# Patient Record
Sex: Male | Born: 1955 | Race: White | Hispanic: No | Smoking: Never smoker
Health system: Southern US, Community
[De-identification: ages and names within clinical notes are randomized; demographics above are authoritative.]

## PROBLEM LIST (undated history)

## (undated) DIAGNOSIS — R972 Elevated prostate specific antigen [PSA]: Secondary | ICD-10-CM

## (undated) DIAGNOSIS — T8859XA Other complications of anesthesia, initial encounter: Secondary | ICD-10-CM

## (undated) DIAGNOSIS — J302 Other seasonal allergic rhinitis: Secondary | ICD-10-CM

## (undated) DIAGNOSIS — N200 Calculus of kidney: Secondary | ICD-10-CM

## (undated) DIAGNOSIS — E669 Obesity, unspecified: Secondary | ICD-10-CM

## (undated) DIAGNOSIS — N529 Male erectile dysfunction, unspecified: Secondary | ICD-10-CM

## (undated) DIAGNOSIS — T4145XA Adverse effect of unspecified anesthetic, initial encounter: Secondary | ICD-10-CM

## (undated) DIAGNOSIS — I10 Essential (primary) hypertension: Secondary | ICD-10-CM

## (undated) DIAGNOSIS — R7301 Impaired fasting glucose: Secondary | ICD-10-CM

## (undated) HISTORY — DX: Essential (primary) hypertension: I10

## (undated) HISTORY — DX: Impaired fasting glucose: R73.01

## (undated) HISTORY — DX: Male erectile dysfunction, unspecified: N52.9

## (undated) HISTORY — DX: Obesity, unspecified: E66.9

## (undated) HISTORY — DX: Other seasonal allergic rhinitis: J30.2

## (undated) HISTORY — DX: Elevated prostate specific antigen (PSA): R97.20

## (undated) HISTORY — PX: CARDIOVASCULAR STRESS TEST: SHX262

---

## 2002-09-01 ENCOUNTER — Encounter: Admission: RE | Admit: 2002-09-01 | Discharge: 2002-09-01 | Payer: Self-pay | Admitting: Sports Medicine

## 2002-09-01 ENCOUNTER — Encounter: Payer: Self-pay | Admitting: Sports Medicine

## 2007-05-27 HISTORY — PX: COLONOSCOPY: SHX174

## 2007-09-17 ENCOUNTER — Ambulatory Visit (HOSPITAL_COMMUNITY): Admission: RE | Admit: 2007-09-17 | Discharge: 2007-09-17 | Payer: Self-pay | Admitting: Internal Medicine

## 2007-09-17 ENCOUNTER — Encounter: Payer: Self-pay | Admitting: Internal Medicine

## 2007-09-17 ENCOUNTER — Ambulatory Visit: Payer: Self-pay | Admitting: Internal Medicine

## 2010-10-08 NOTE — Op Note (Signed)
NAME:  Oscar Campbell, Oscar Campbell NO.:  [id number]   MEDICAL RECORD NO.:  [id number]          PATIENT TYPE:  AMB   LOCATION:  DAY                           FACILITY:  [facility]   PHYSICIAN:  R. Roetta Sessions, M.D. DATE OF BIRTH:  [birth year]   DATE OF PROCEDURE:  09/17/2007  DATE OF DISCHARGE:                               OPERATIVE REPORT   Colonoscopy snare polypectomy.   INDICATIONS FOR PROCEDURE:  A 55 year old gentleman with lower GI tract  symptoms, positive family history of colon polyps in his father, but no  family history of colorectal cancer.  Colonoscopy is now being done.  He  has never had his lower GI tract imaged, and he is devoid of any lower  GI tract symptoms.  Risks, benefits, alternatives, and limitations have  been reviewed.  Oscar Campbell had his questions answered, and he is  agreeable.  Please see documentation and medical record.   PROCEDURE NOTE:  O2 saturation, blood pressure, pulse, and respirations  monitored throughout the entire procedure.   CONSCIOUS SEDATION:  Versed 5 mg IV and Demerol 100 mg IV in divided  doses.   INSTRUMENT:  Pentax video chip system.   FINDINGS:  Digital rectal exam revealed no abnormalities.  Endoscopic  findings:  The prep was adequate.  Colon:  Colonic mucosa was surveyed  from the rectosigmoid junction through the left transverse, right colon,  appendiceal orifice, ileocecal valve, and cecum.  These structures were  well seen and photographed for the record.  From this level, scope was  slowly cautiously withdrawn.  All previously mentioned mucosal surfaces  were again seen.  The patient had a 6-mm polyp on the stalk of the mid  ascending colon which was cold snared.  There were 2 other pedunculated  polyps in the mid descending colon which were also cold snared and  removed.  The remainder of colonic mucosa appeared normal.  The scope  was pulled down to the rectum with examination of rectal mucosa,  including  retroflexed view of the anal verge demonstrated no  abnormalities.  The patient tolerated the procedure well and left the  endoscopy area.   IMPRESSION:  Normal rectum, colonic polyps removed as described above,  right colon mucosa appeared normal.   RECOMMENDATIONS:  1. Follow up on path.  2. Further recommendations to follow.      Jonathon Bellows, M.D.  Electronically Signed     RMR/MEDQ  D:  09/17/2007  T:  09/18/2007  Job:  [id number]   cc:   Donna Bernard, M.D.  Fax: [phone]

## 2011-01-20 ENCOUNTER — Ambulatory Visit (INDEPENDENT_AMBULATORY_CARE_PROVIDER_SITE_OTHER): Payer: BC Managed Care – PPO | Admitting: Adult Health

## 2011-01-20 ENCOUNTER — Encounter: Payer: Self-pay | Admitting: *Deleted

## 2011-01-20 ENCOUNTER — Encounter: Payer: Self-pay | Admitting: Adult Health

## 2011-01-20 DIAGNOSIS — I10 Essential (primary) hypertension: Secondary | ICD-10-CM

## 2011-01-20 DIAGNOSIS — R079 Chest pain, unspecified: Secondary | ICD-10-CM | POA: Insufficient documentation

## 2011-01-20 NOTE — Assessment & Plan Note (Signed)
Currently better controlled on Vasotec 5 mg.  Will not make any medication changes at this time. Echocardiogram for Lv fx.

## 2011-01-20 NOTE — Progress Notes (Signed)
HPI:  [name] is a 55 y/o patient of [name] we are asked to see for recurrent chest pain. He has no prior cardac history.  He has recently been diagnosed with hypertension while being evaluated in [name] office last week and placed on Vasotec 5 mg for elevated BP of 158 -161 systolic.  He states 2 weeks ago, he had an occurrence of chest pressure, tightness when he awoke with his alarm to go to work. He states it lasted about 15-20 minutes and went away on its own.  He states he felt weak and fatigued for the rest of the day, actually going home to rest at lunch.  He felt the same the next day, without recurrence of chest discomfort and only worked a half day again.  He saw [name] the following day and had EKG completed that was abnormal.  He was referred to cardiology for further assessment.  He states he is now feeling better on the Vasotec for the last 3 days, but he is concerned about his heart.  Allergies  Allergen Reactions  . Penicillins     Current Outpatient Prescriptions  Medication Sig Dispense Refill  . aspirin 81 MG tablet Take 81 mg by mouth daily.        . enalapril (VASOTEC) 5 MG tablet Take 5 mg by mouth daily.          Past Medical History  Diagnosis Date  . Hypertension     No past surgical history on file.  JYN:WGNFAO of systems complete and found to be negative unless listed above PHYSICAL EXAM BP 141/80  Pulse 78  Resp 18  Ht 5\' 9"  (1.753 m)  Wt 243 lb (110.224 kg)  BMI 35.88 kg/m2  EKG:NSR, Left Axis deviation with poor R-wave progression. Rate of 80 bpm.  ASSESSMENT AND PLAN

## 2011-01-20 NOTE — Assessment & Plan Note (Signed)
He has family history of CAD in his father, and newly diagnosed hypertension. Review of current labs for cholesterol demonstrate at TC of 159, TG 118, LDL 99, HDL 36.  The patient has also been seen by Dr. Dietrich Pates as a new patient. He will have a stress echo for diagnostic and prognostic purposes.  He will follow-up with [place] after test is completed for more recommendations.

## 2011-01-20 NOTE — Patient Instructions (Addendum)
Echo Stress Echo Follow up after test above

## 2011-01-28 ENCOUNTER — Ambulatory Visit (HOSPITAL_COMMUNITY)
Admission: RE | Admit: 2011-01-28 | Discharge: 2011-01-28 | Disposition: A | Discharge status: Home / Self Care | Payer: BC Managed Care – PPO | Source: Ambulatory Visit | Attending: Cardiology | Admitting: Cardiology

## 2011-01-28 ENCOUNTER — Encounter (HOSPITAL_COMMUNITY): Payer: Self-pay | Admitting: Cardiology

## 2011-01-28 DIAGNOSIS — I10 Essential (primary) hypertension: Secondary | ICD-10-CM | POA: Insufficient documentation

## 2011-01-28 DIAGNOSIS — R079 Chest pain, unspecified: Secondary | ICD-10-CM

## 2011-01-28 DIAGNOSIS — Z8249 Family history of ischemic heart disease and other diseases of the circulatory system: Secondary | ICD-10-CM | POA: Insufficient documentation

## 2011-01-28 DIAGNOSIS — R072 Precordial pain: Secondary | ICD-10-CM

## 2011-01-28 DIAGNOSIS — I517 Cardiomegaly: Secondary | ICD-10-CM

## 2011-01-28 NOTE — Progress Notes (Signed)
*  PRELIMINARY RESULTS* Echocardiogram Echocardiogram Stress Test has been performed.  Oscar Campbell 01/28/2011, 10:59 AM

## 2011-01-28 NOTE — Progress Notes (Signed)
Stress Lab Nurses Notes - Oscar Campbell  [name] 01/28/2011  Reason for doing test: Chest Pain  Type of test: Stress Echo  Nurse performing test: Marlena Clipper, RN  Nuclear Medicine Tech: Not Applicable  Echo Tech: Karrie Doffing  MD performing test: R. Dietrich Pates  Family MD: Lubertha South  Test explained and consent signed: yes  IV started: No IV started  Symptoms: Sob  Treatment/Intervention: None  Reason test stopped: reached target HR and SOB  After recovery IV was: no iv inserted  Patient to return to Nuc. Med at :N/A  Patient discharged: Home  Patient's Condition upon discharge was: stable  Comments: Symptoms resolved in recovery. Resting HR77 BP130/70 and peak HR159 Peak BP 148/82.  [name]

## 2011-01-28 NOTE — Progress Notes (Signed)
*  PRELIMINARY RESULTS* Echocardiogram 2D Echocardiogram has been performed.  Oscar Campbell 01/28/2011, 9:34 AM

## 2011-02-03 ENCOUNTER — Encounter: Payer: Self-pay | Admitting: Adult Health

## 2011-02-04 ENCOUNTER — Ambulatory Visit: Payer: BC Managed Care – PPO | Admitting: Adult Health

## 2011-02-07 ENCOUNTER — Encounter: Payer: Self-pay | Admitting: Adult Health

## 2011-02-07 ENCOUNTER — Ambulatory Visit (INDEPENDENT_AMBULATORY_CARE_PROVIDER_SITE_OTHER): Payer: BC Managed Care – PPO | Admitting: Adult Health

## 2011-02-07 DIAGNOSIS — R079 Chest pain, unspecified: Secondary | ICD-10-CM

## 2011-02-07 DIAGNOSIS — I10 Essential (primary) hypertension: Secondary | ICD-10-CM

## 2011-02-07 NOTE — Assessment & Plan Note (Addendum)
Blood pressure is well controlled on current medications. No changes at this time.He is given instructions on low salt diet and encouraged to lose wt.

## 2011-02-07 NOTE — Progress Notes (Signed)
HPI:  [name] is a 55 y/o patient of [name] we are asked to see for recurrent chest pain. He has no prior cardac history.  He has recently been diagnosed with hypertension while being evaluated in [name] office last week and placed on Vasotec 5 mg for elevated BP of 158 -161 systolic.  He states 2 weeks ago, he had an occurrence of chest pressure, tightness when he awoke with his alarm to go to work. He states it lasted about 15-20 minutes and went away on its own.  He states he felt weak and fatigued for the rest of the day, actually going home to rest at lunch.  He felt the same the next day, without recurrence of chest discomfort and only worked a half day again.  He saw [name] the following day and had EKG completed that was abnormal.  He was referred to cardiology for further assessment.  He states he is now feeling better on the Vasotec for the last 3 days, but he is concerned about his heart.  On last visit I ordered stress echo for evaluation. He is here for results. Allergies  Allergen Reactions  . Penicillins     Current Outpatient Prescriptions  Medication Sig Dispense Refill  . aspirin 81 MG tablet Take 81 mg by mouth daily.        . enalapril (VASOTEC) 5 MG tablet Take 5 mg by mouth daily.          Past Medical History  Diagnosis Date  . Hypertension     No past surgical history on file.  VWU:[id number] of systems complete and found to be negative unless listed above PHYSICAL EXAM BP 137/82  Pulse 73  Resp 18  Ht 5\' 9"  (1.753 m)  Wt 241 lb 6.4 oz (109.498 kg)  BMI 35.65 kg/m2  SpO2 99%  EKG:NSR, Left Axis deviation with poor R-wave progression. Rate of 80 bpm.  ASSESSMENT AND PLAN

## 2011-02-07 NOTE — Patient Instructions (Signed)
Your physician recommends that you schedule a follow-up appointment in: 6 months  

## 2011-02-07 NOTE — Assessment & Plan Note (Signed)
Stress echo completed on 01/28/2011 was found to be negative for ischemia both by EKG criteria and echocardiographic evidence.Oscar Campbell  He is given reassurance and is counseled on wt loss and increasing exercise.  He states he has begun a walking program after work. He is is follow-up in 6 months for check in on progress and BP evaluation. He is to call for recurrent chest pain.

## 2011-03-07 ENCOUNTER — Encounter: Payer: Self-pay | Admitting: Adult Health

## 2011-05-20 ENCOUNTER — Emergency Department (HOSPITAL_COMMUNITY)
Admission: EM | Admit: 2011-05-20 | Discharge: 2011-05-20 | Disposition: A | Discharge status: Home / Self Care | Payer: BC Managed Care – PPO | Attending: Emergency Medicine | Admitting: Emergency Medicine

## 2011-05-20 ENCOUNTER — Encounter (HOSPITAL_COMMUNITY): Payer: Self-pay | Admitting: *Deleted

## 2011-05-20 DIAGNOSIS — S61209A Unspecified open wound of unspecified finger without damage to nail, initial encounter: Secondary | ICD-10-CM | POA: Insufficient documentation

## 2011-05-20 DIAGNOSIS — IMO0002 Reserved for concepts with insufficient information to code with codable children: Secondary | ICD-10-CM

## 2011-05-20 DIAGNOSIS — I10 Essential (primary) hypertension: Secondary | ICD-10-CM | POA: Insufficient documentation

## 2011-05-20 DIAGNOSIS — W261XXA Contact with sword or dagger, initial encounter: Secondary | ICD-10-CM | POA: Insufficient documentation

## 2011-05-20 DIAGNOSIS — W260XXA Contact with knife, initial encounter: Secondary | ICD-10-CM | POA: Insufficient documentation

## 2011-05-20 MED ORDER — LIDOCAINE HCL (PF) 1 % IJ SOLN
INTRAMUSCULAR | Status: AC
  Administered 2011-05-20: 19:00:00
  Filled 2011-05-20: qty 5

## 2011-05-20 NOTE — ED Notes (Signed)
Pt was cutting a piece of plastic with a knife when the knife slipped, pt has laceration to the outside of the left hand below the thumb area, bleeding controlled, dressing applied at triage, cms intact distal

## 2011-05-20 NOTE — ED Provider Notes (Signed)
History     CSN: [id number]  Arrival date & time 05/20/11  1552   First MD Initiated Contact with Patient 05/20/11 1610      Chief Complaint  Patient presents with  . Extremity Laceration    (Consider location/radiation/quality/duration/timing/severity/associated sxs/prior treatment) HPI Comments: Patient c/o laceration to the dorsal aspect of the proximal left thumb.  Occurred while using a knife to cut a piece of plastic.  Denies weakness or numbness of the digit.    Patient is a 55 y.o. male presenting with skin laceration. The history is provided by the patient.  Laceration  The incident occurred 1 to 2 hours ago. The laceration is located on the left hand. The laceration is 3 cm in size. The laceration mechanism was a a clean knife. The pain is mild. The pain has been constant since onset. He reports no foreign bodies present. His tetanus status is UTD.    Past Medical History  Diagnosis Date  . Hypertension     History reviewed. No pertinent past surgical history.  Family History  Problem Relation Age of Onset  . Heart disease Father     History  Substance Use Topics  . Smoking status: Never Smoker   . Smokeless tobacco: Not on file  . Alcohol Use: No      Review of Systems  Musculoskeletal: Negative for myalgias and arthralgias.  Skin: Positive for wound. Negative for rash.  Neurological: Negative for dizziness, weakness and numbness.  Hematological: Does not bruise/bleed easily.    Allergies  Penicillins  Home Medications   Current Outpatient Rx  Name Route Sig Dispense Refill  . ASPIRIN EC 81 MG PO TBEC Oral Take 81 mg by mouth daily.      . ENALAPRIL MALEATE 5 MG PO TABS Oral Take 5 mg by mouth at bedtime.       BP 166/93  Pulse 91  Temp(Src) 97.7 F (36.5 C) (Oral)  Resp 20  Ht 5\' 10"  (1.778 m)  Wt 235 lb (106.595 kg)  BMI 33.72 kg/m2  SpO2 99%  Physical Exam  Nursing note and vitals reviewed. Constitutional: He is oriented to  person, place, and time. He appears well-developed and well-nourished. No distress.  HENT:  Head: Normocephalic and atraumatic.  Cardiovascular: Normal rate, regular rhythm and normal heart sounds.   Pulmonary/Chest: Effort normal and breath sounds normal.  Musculoskeletal: Normal range of motion. He exhibits tenderness. He exhibits no edema.       Left hand: He exhibits tenderness and laceration. He exhibits normal range of motion, no bony tenderness, normal two-point discrimination, normal capillary refill, no deformity and no swelling. normal sensation noted. Normal strength noted. He exhibits no finger abduction and no thumb/finger opposition.       Hands: Neurological: He is alert and oriented to person, place, and time. No cranial nerve deficit. He exhibits normal muscle tone. Coordination normal.  Skin: Skin is warm and dry.       See MS exam    ED Course  Procedures (including critical care time)   LACERATION REPAIR Performed by: Kinsie Belford L. Authorized by: Maxwell Caul Consent: Verbal consent obtained. Risks and benefits: risks, benefits and alternatives were discussed Consent given by: patient Patient identity confirmed: provided demographic data Prepped and Draped in normal sterile fashion Wound explored  Laceration Location: proximal left thumb Laceration Length: 3cm  No Foreign Bodies seen or palpated  Anesthesia: local infiltration  Local anesthetic: lidocaine1% w/o epinephrine  Anesthetic total: 2 ml  Irrigation  method: syringe Amount of cleaning: standard  Skin closure: 4-0 prolene Number of sutures: 4 Technique:simple interrupted   Patient tolerance: Patient tolerated the procedure well with no immediate complications.     MDM    Wound(s) explored with adequate hemostasis through ROM, no apparent gross foreign body retained, no significant involvement of deep structures such as bone / joint / tendon / or neurovascular involvement noted.   Baseline Strength and Sensation to affected extremity(ies) with normal light touch for Pt, distal NVI with CR< 2 secs and pulse(s) intact to affected extremity(ies).       Letta Cargile L. [place], [place] 05/20/11 [phone]

## 2011-05-20 NOTE — ED Notes (Signed)
Pt states that his last tetanus was ?2 years ago,

## 2011-05-20 NOTE — ED Notes (Signed)
Pt states cut base of thumb with pocket knife attempting to open hard plastic  "clam shell packaging". Pt reports this happened about an hour ago.  Bleeding is controlled.

## 2011-05-21 NOTE — ED Provider Notes (Signed)
Medical screening examination/treatment/procedure(s) were performed by non-physician practitioner and as supervising physician I was immediately available for consultation/collaboration. Devoria Albe, MD, FACEP   Ward Givens, MD 05/21/11 [phone]

## 2012-10-19 ENCOUNTER — Encounter: Payer: Self-pay | Admitting: Internal Medicine

## 2012-12-30 ENCOUNTER — Telehealth: Payer: Self-pay | Admitting: Family Medicine

## 2012-12-30 ENCOUNTER — Other Ambulatory Visit: Payer: Self-pay

## 2012-12-30 MED ORDER — ENALAPRIL MALEATE 5 MG PO TABS: 5.0000 mg | ORAL_TABLET | Freq: Every day | ORAL | Status: DC

## 2012-12-30 NOTE — Telephone Encounter (Signed)
RX sent into [facility]. Left message on voicemail notifying patient.

## 2012-12-30 NOTE — Telephone Encounter (Signed)
Patient needs Rx for enalapril  [facility]

## 2013-01-08 ENCOUNTER — Encounter: Payer: Self-pay | Admitting: *Deleted

## 2013-01-10 ENCOUNTER — Ambulatory Visit: Payer: Self-pay | Admitting: Family Medicine

## 2013-01-17 ENCOUNTER — Encounter: Payer: Self-pay | Admitting: Family Medicine

## 2013-01-17 ENCOUNTER — Ambulatory Visit (INDEPENDENT_AMBULATORY_CARE_PROVIDER_SITE_OTHER): Payer: PRIVATE HEALTH INSURANCE | Admitting: Family Medicine

## 2013-01-17 VITALS — BP 138/82 | Ht 71.0 in | Wt 239.0 lb

## 2013-01-17 DIAGNOSIS — J302 Other seasonal allergic rhinitis: Secondary | ICD-10-CM

## 2013-01-17 DIAGNOSIS — Z125 Encounter for screening for malignant neoplasm of prostate: Secondary | ICD-10-CM

## 2013-01-17 DIAGNOSIS — Z79899 Other long term (current) drug therapy: Secondary | ICD-10-CM

## 2013-01-17 DIAGNOSIS — J309 Allergic rhinitis, unspecified: Secondary | ICD-10-CM

## 2013-01-17 DIAGNOSIS — N529 Male erectile dysfunction, unspecified: Secondary | ICD-10-CM

## 2013-01-17 DIAGNOSIS — E782 Mixed hyperlipidemia: Secondary | ICD-10-CM

## 2013-01-17 DIAGNOSIS — R7301 Impaired fasting glucose: Secondary | ICD-10-CM

## 2013-01-17 MED ORDER — SCOPOLAMINE 1 MG/3DAYS TD PT72: 1.0000 | MEDICATED_PATCH | TRANSDERMAL | Status: DC

## 2013-01-17 MED ORDER — ENALAPRIL MALEATE 5 MG PO TABS: 5.0000 mg | ORAL_TABLET | Freq: Every day | ORAL | Status: DC

## 2013-01-17 NOTE — Progress Notes (Signed)
  Subjective:    Patient ID: Oscar Campbell, male    DOB: 05/05/1956, 57 y.o.   MRN: [id number]  HPI Patient arrives for follow up on blood pressure. Currently on Enalapril 5 mg daily. Claims fair compliance with medication.  Going on a cruise in October and worried about motion sickness.   Also discuss fungus of the nails. Would like to try to get fungus out from under the nails.  Positive history of impaired fasting glucose. Trying to watch his diet. Status uncertain.  History of erectile dysfunction clinically stable  Review of Systems No headache no chest pain no abdominal pain ROS otherwise negative    Objective:   Physical Exam  Alert hydration good. HEENT normal. Lungs clear. Heart regular in rhythm. Ankles without edema. Pulses good.      Assessment & Plan:  Impression hypertension good control. #2 motion sickness concerns discussed. #3 impaired fasting glucose status uncertain. #4 erectile dysfunction. Plan appropriate blood work. Diet exercise discussed. Further recommendations based on blood work results. WSL

## 2013-01-20 DIAGNOSIS — N529 Male erectile dysfunction, unspecified: Secondary | ICD-10-CM | POA: Insufficient documentation

## 2013-01-20 DIAGNOSIS — R7301 Impaired fasting glucose: Secondary | ICD-10-CM | POA: Insufficient documentation

## 2013-01-20 DIAGNOSIS — J302 Other seasonal allergic rhinitis: Secondary | ICD-10-CM | POA: Insufficient documentation

## 2013-01-22 LAB — HEPATIC FUNCTION PANEL
ALT: 24 U/L (ref 0–53)
AST: 23 U/L (ref 0–37)
Albumin: 4.3 g/dL (ref 3.5–5.2)
Alkaline Phosphatase: 72 U/L (ref 39–117)
Bilirubin, Direct: 0.1 mg/dL (ref 0.0–0.3)
Indirect Bilirubin: 0.7 mg/dL (ref 0.0–0.9)
Total Bilirubin: 0.8 mg/dL (ref 0.3–1.2)
Total Protein: 7 g/dL (ref 6.0–8.3)

## 2013-01-22 LAB — BASIC METABOLIC PANEL
BUN: 14 mg/dL (ref 6–23)
CO2: 26 mEq/L (ref 19–32)
Calcium: 9.2 mg/dL (ref 8.4–10.5)
Chloride: 105 mEq/L (ref 96–112)
Creat: 0.89 mg/dL (ref 0.50–1.35)
Glucose, Bld: 90 mg/dL (ref 70–99)
Potassium: 4.4 mEq/L (ref 3.5–5.3)
Sodium: 140 mEq/L (ref 135–145)

## 2013-01-22 LAB — LIPID PANEL
Cholesterol: 155 mg/dL (ref 0–200)
HDL: 36 mg/dL — ABNORMAL LOW (ref >39)
LDL Cholesterol: 94 mg/dL (ref 0–99)
Total CHOL/HDL Ratio: 4.3 Ratio
Triglycerides: 126 mg/dL (ref <150)
VLDL: 25 mg/dL (ref 0–40)

## 2013-01-22 LAB — PSA: PSA: 0.91 ng/mL (ref <=4.00)

## 2013-01-25 ENCOUNTER — Encounter: Payer: Self-pay | Admitting: Family Medicine

## 2013-04-27 ENCOUNTER — Ambulatory Visit (INDEPENDENT_AMBULATORY_CARE_PROVIDER_SITE_OTHER): Payer: PRIVATE HEALTH INSURANCE | Admitting: Family Medicine

## 2013-04-27 ENCOUNTER — Encounter: Payer: Self-pay | Admitting: Family Medicine

## 2013-04-27 VITALS — BP 136/90 | Temp 98.3°F | Ht 71.0 in | Wt 237.2 lb

## 2013-04-27 DIAGNOSIS — J329 Chronic sinusitis, unspecified: Secondary | ICD-10-CM

## 2013-04-27 MED ORDER — LOSARTAN POTASSIUM 50 MG PO TABS: 50.0000 mg | ORAL_TABLET | Freq: Every day | ORAL | Status: DC

## 2013-04-27 MED ORDER — LEVOFLOXACIN 500 MG PO TABS: 500.0000 mg | ORAL_TABLET | Freq: Every day | ORAL | Status: AC

## 2013-04-27 NOTE — Progress Notes (Signed)
   Subjective:    Patient ID: Oscar Campbell, male    DOB: 24-May-1956, 57 y.o.   MRN: [id number]  Sinusitis This is a new problem. The current episode started in the past 7 days. Associated symptoms include congestion, coughing and sinus pressure. Past treatments include oral decongestants. The treatment provided mild relief.   Cough assoc with bp meds for quite some time ever since starting prior blood pressure medicine  Diminished energy headache comes and goes sharp frontal radiates towards years.   Review of Systems  HENT: Positive for congestion and sinus pressure.   Respiratory: Positive for cough.    No vomiting no diarrhea ROS otherwise negative    Objective:   Physical Exam  Alert good hydration H&T moderate his congestion. Frontal tenderness. Trace her Xanax supple. Lungs clear heart rare rhythm. Blood pressure similar on repeat      Assessment & Plan:  Impression 1 rhinosinusitis #2 hypertension with possible side effects from medicine. Plan stop enalapril. Start losartan. Antibiotics prescribed. Symptomatic care discussed. WSL

## 2013-05-29 ENCOUNTER — Emergency Department (HOSPITAL_COMMUNITY): Payer: No Typology Code available for payment source

## 2013-05-29 ENCOUNTER — Emergency Department (HOSPITAL_COMMUNITY)
Admission: EM | Admit: 2013-05-29 | Discharge: 2013-05-29 | Disposition: A | Discharge status: Home / Self Care | Payer: No Typology Code available for payment source | Attending: Emergency Medicine | Admitting: Emergency Medicine

## 2013-05-29 ENCOUNTER — Encounter (HOSPITAL_COMMUNITY): Payer: Self-pay | Admitting: Emergency Medicine

## 2013-05-29 DIAGNOSIS — J209 Acute bronchitis, unspecified: Secondary | ICD-10-CM | POA: Insufficient documentation

## 2013-05-29 DIAGNOSIS — I10 Essential (primary) hypertension: Secondary | ICD-10-CM | POA: Insufficient documentation

## 2013-05-29 DIAGNOSIS — Z79899 Other long term (current) drug therapy: Secondary | ICD-10-CM | POA: Insufficient documentation

## 2013-05-29 DIAGNOSIS — Z7982 Long term (current) use of aspirin: Secondary | ICD-10-CM | POA: Insufficient documentation

## 2013-05-29 DIAGNOSIS — J3489 Other specified disorders of nose and nasal sinuses: Secondary | ICD-10-CM | POA: Insufficient documentation

## 2013-05-29 DIAGNOSIS — J40 Bronchitis, not specified as acute or chronic: Secondary | ICD-10-CM

## 2013-05-29 DIAGNOSIS — E669 Obesity, unspecified: Secondary | ICD-10-CM | POA: Insufficient documentation

## 2013-05-29 DIAGNOSIS — Z880 Allergy status to penicillin: Secondary | ICD-10-CM | POA: Insufficient documentation

## 2013-05-29 DIAGNOSIS — Z87448 Personal history of other diseases of urinary system: Secondary | ICD-10-CM | POA: Insufficient documentation

## 2013-05-29 MED ORDER — AZITHROMYCIN 250 MG PO TABS: 250.0000 mg | ORAL_TABLET | Freq: Every day | ORAL | Status: DC

## 2013-05-29 MED ORDER — FLUTICASONE PROPIONATE 50 MCG/ACT NA SUSP: 1.0000 | Freq: Every day | NASAL | Status: DC

## 2013-05-29 MED ORDER — BENZONATATE 100 MG PO CAPS: 100.0000 mg | ORAL_CAPSULE | Freq: Three times a day (TID) | ORAL | Status: DC | PRN

## 2013-05-29 MED ORDER — ALBUTEROL SULFATE HFA 108 (90 BASE) MCG/ACT IN AERS: 2.0000 | INHALATION_SPRAY | RESPIRATORY_TRACT | Status: DC | PRN

## 2013-05-29 NOTE — ED Provider Notes (Signed)
CSN: [id number]     Arrival date & time 05/29/13  0732 History  This chart was scribed for Orpah Greek, MD by Jenne Campus, ED Scribe. This patient was seen in room APA06/[facility] and the patient's care was started at 7:43 AM.   Chief Complaint  Patient presents with  . Cough    The history is provided by the patient. No language interpreter was used.    HPI Comments: MCCARTNEY CHUBA is a 58 y.o. male who presents to the Emergency Department complaining of cough productive of yellow sputum for the past 2 months with associated nasal congestion and sinus drainage. He states that he was prescribed an antibiotic (1 tablet a day for 10 days) for a sinus infection with improvement in the sinus drainage last week. He states that the cough is still present and he is here today due to concern over a "walking PNA".   Past Medical History  Diagnosis Date  . Hypertension   . Obesity   . Erectile dysfunction   . Elevated PSA   . Impaired fasting glucose   . Seasonal rhinitis    Past Surgical History  Procedure Laterality Date  . Cardiovascular stress test    . Colonoscopy  2009    tubular adenoma   Family History  Problem Relation Age of Onset  . Heart disease Father    History  Substance Use Topics  . Smoking status: Never Smoker   . Smokeless tobacco: Not on file  . Alcohol Use: No    Review of Systems  Constitutional: Negative for fever.  HENT: Positive for congestion.   Respiratory: Positive for cough.   Cardiovascular: Negative for chest pain.  All other systems reviewed and are negative.    Allergies  Penicillins  Home Medications   Current Outpatient Rx  Name  Route  Sig  Dispense  Refill  . aspirin EC 81 MG tablet   Oral   Take 81 mg by mouth daily.           Marland Kitchen losartan (COZAAR) 50 MG tablet   Oral   Take 1 tablet (50 mg total) by mouth daily.   30 tablet   6    Triage Vitals: BP 174/97  Pulse 79  Temp(Src) 98 F (36.7 C) (Oral)  Resp 18   SpO2 98%  Physical Exam  Nursing note and vitals reviewed. Constitutional: He is oriented to person, place, and time. He appears well-developed and well-nourished. No distress.  HENT:  Head: Normocephalic and atraumatic.  Right Ear: Hearing normal.  Left Ear: Hearing normal.  Eyes: Conjunctivae and EOM are normal. Pupils are equal, round, and reactive to light.  Neck: Normal range of motion. Neck supple.  Cardiovascular: Normal rate, regular rhythm, S1 normal and S2 normal.  Exam reveals no gallop and no friction rub.   No murmur heard. Pulmonary/Chest: Effort normal and breath sounds normal. No respiratory distress. He exhibits no tenderness.  Musculoskeletal: Normal range of motion.  Neurological: He is alert and oriented to person, place, and time. He has normal strength. No cranial nerve deficit or sensory deficit. Coordination normal. GCS eye subscore is 4. GCS verbal subscore is 5. GCS motor subscore is 6.  Skin: Skin is warm, dry and intact. No rash noted. No cyanosis.  Psychiatric: He has a normal mood and affect. His speech is normal and behavior is normal. Thought content normal.    ED Course  Procedures (including critical care time)  DIAGNOSTIC STUDIES: Oxygen Saturation  is 98% on RA, normal by my interpretation.    COORDINATION OF CARE: 7:48 AM-Discussed treatment plan which includes CXR with pt at bedside and pt agreed to plan.   Labs Review Labs Reviewed - No data to display Imaging Review No results found.  EKG Interpretation   None       MDM  Diagnosis: Bronchitis  Patient presents to the ER for evaluation of cough and chest congestion. Patient was treated for a sinus infection proximally one half months ago. He reports improvement of his symptoms, but in the last several days he has had some mild sinus congestion followed by progressively worsening cough and chest congestion. Lungs are clear on auscultation today. No obvious wheezing and he is breathing  comfortably with normal oxygenation. Chest x-ray performed and does not show any evidence of pneumonia. Patient to be treated for bronchitis with possible intermittent bronchospasm.  I personally performed the services described in this documentation, which was scribed in my presence. The recorded information has been reviewed and is accurate.     Orpah Greek, MD 05/29/13 [phone]

## 2013-05-29 NOTE — Discharge Instructions (Signed)

## 2013-05-29 NOTE — ED Notes (Signed)
Pt states productive cough, yellow in color since day after Thanksgiving. Pt was treated with antibiotics for sinus infection at that time but states cough is still present and nasal congestion and drainage still occuring.

## 2013-07-14 ENCOUNTER — Encounter: Payer: Self-pay | Admitting: Family Medicine

## 2013-07-14 ENCOUNTER — Ambulatory Visit (INDEPENDENT_AMBULATORY_CARE_PROVIDER_SITE_OTHER): Payer: PRIVATE HEALTH INSURANCE | Admitting: Family Medicine

## 2013-07-14 VITALS — BP 142/94 | Ht 71.0 in | Wt 244.4 lb

## 2013-07-14 DIAGNOSIS — I10 Essential (primary) hypertension: Secondary | ICD-10-CM

## 2013-07-14 MED ORDER — LOSARTAN POTASSIUM 100 MG PO TABS: 100.0000 mg | ORAL_TABLET | Freq: Every day | ORAL | Status: DC

## 2013-07-14 NOTE — Progress Notes (Signed)
   Subjective:    Patient ID: Oscar Campbell, male    DOB: 02-06-1956, 58 y.o.   MRN: [id number]  HPIHypertension.   Numbers often high. Trying to watch salt intake. No cp  Blood pressure running high. Blood pressures when checked elsewhere are often elevated.  Occasional mild headache.  Energy level fair.    Exercise level suboptimum  Review of Systems No chest pain no back pain no abdominal pain no change in bowel habits no blood in stool ROS otherwise negative    Objective:   Physical Exam Alert vitals reviewed. HEENT normal. Lungs clear. Heart regular rate and rhythm. Blood pressure 142/90 on repeat.       Assessment & Plan:  Impression 1 hypertension suboptimal in control discussed plan increase to 100 mg losartan daily. Diet exercise discussed. Recheck as scheduled. WSL

## 2013-12-15 ENCOUNTER — Encounter: Payer: Self-pay | Admitting: Family Medicine

## 2013-12-15 ENCOUNTER — Ambulatory Visit (INDEPENDENT_AMBULATORY_CARE_PROVIDER_SITE_OTHER): Payer: BC Managed Care – PPO | Admitting: Family Medicine

## 2013-12-15 VITALS — BP 136/84 | Temp 98.3°F | Ht 71.0 in | Wt 240.1 lb

## 2013-12-15 DIAGNOSIS — Z125 Encounter for screening for malignant neoplasm of prostate: Secondary | ICD-10-CM

## 2013-12-15 DIAGNOSIS — R319 Hematuria, unspecified: Secondary | ICD-10-CM

## 2013-12-15 DIAGNOSIS — E785 Hyperlipidemia, unspecified: Secondary | ICD-10-CM

## 2013-12-15 DIAGNOSIS — R109 Unspecified abdominal pain: Secondary | ICD-10-CM

## 2013-12-15 DIAGNOSIS — R1030 Lower abdominal pain, unspecified: Secondary | ICD-10-CM

## 2013-12-15 LAB — POCT URINALYSIS DIPSTICK
Blood, UA: 250
Protein, UA: 30
Spec Grav, UA: >=1.03
pH, UA: 5

## 2013-12-15 MED ORDER — CIPROFLOXACIN HCL 500 MG PO TABS: 500.0000 mg | ORAL_TABLET | Freq: Two times a day (BID) | ORAL | Status: DC

## 2013-12-15 NOTE — Progress Notes (Signed)
   Subjective:    Patient ID: Oscar Campbell, male    DOB: 03-Sep-1955, 58 y.o.   MRN: [id number]  Abdominal Pain This is a new problem. The current episode started 1 to 4 weeks ago. The onset quality is sudden. The problem occurs intermittently. The problem has been unchanged. The pain is located in the generalized abdominal region. The pain is at a severity of 6/10. The pain is moderate. The quality of the pain is sharp. Nothing aggravates the pain. The pain is relieved by nothing. Treatments tried: ibuprofen  The treatment provided moderate relief.   Patient states that he has no other concerns at this time.  Patient relates that he had sharp pains on his right side radiated down into the right lower quadrant. No dysuria no urinary frequency but some abnormal smells to the urine. Denies fever chills  Review of Systems  Gastrointestinal: Positive for abdominal pain.       Objective:   Physical Exam Prostate exam slight enlargement but otherwise normal no tenderness no pain no nodules  Lungs clear heart regular flanks nontender abdomen soft minimal right lower quadrant tenderness no guarding no rebound  Urinalysis with RBCs 5-10 per hpf 2-3 WBCs per HPF some bacteria   Signs and symptoms of kidney stone discussed with the patient if he gets worse immediately followup      Assessment & Plan:  Abnormal urine-do urine culture. Cipro 500 twice a day 10 days. Patient needs followup in a few weeks to recheck urine at that time  If urine culture is negative I highly recommend CT scan of the abdomen hematuria protocol. Possibility of a kidney stone exam is. If hematuria persists he will also need cystoscope.

## 2013-12-17 LAB — URINE CULTURE: Colony Count: 70000

## 2013-12-17 LAB — BASIC METABOLIC PANEL
BUN: 12 mg/dL (ref 6–23)
CO2: 25 mEq/L (ref 19–32)
Calcium: 8.9 mg/dL (ref 8.4–10.5)
Chloride: 105 mEq/L (ref 96–112)
Creat: 0.89 mg/dL (ref 0.50–1.35)
Glucose, Bld: 102 mg/dL — ABNORMAL HIGH (ref 70–99)
Potassium: 4 mEq/L (ref 3.5–5.3)
Sodium: 139 mEq/L (ref 135–145)

## 2013-12-17 LAB — CBC WITH DIFFERENTIAL/PLATELET
Basophils Absolute: 0.1 10*3/uL (ref 0.0–0.1)
Basophils Relative: 1 % (ref 0–1)
Eosinophils Absolute: 0.2 10*3/uL (ref 0.0–0.7)
Eosinophils Relative: 3 % (ref 0–5)
HCT: 42.9 % (ref 39.0–52.0)
Hemoglobin: 14.7 g/dL (ref 13.0–17.0)
Lymphocytes Relative: 26 % (ref 12–46)
Lymphs Abs: 2 10*3/uL (ref 0.7–4.0)
MCH: 30.1 pg (ref 26.0–34.0)
MCHC: 34.3 g/dL (ref 30.0–36.0)
MCV: 87.7 fL (ref 78.0–100.0)
Monocytes Absolute: 0.6 10*3/uL (ref 0.1–1.0)
Monocytes Relative: 8 % (ref 3–12)
Neutro Abs: 4.7 10*3/uL (ref 1.7–7.7)
Neutrophils Relative %: 62 % (ref 43–77)
Platelets: 176 10*3/uL (ref 150–400)
RBC: 4.89 MIL/uL (ref 4.22–5.81)
RDW: 14.2 % (ref 11.5–15.5)
WBC: 7.6 10*3/uL (ref 4.0–10.5)

## 2013-12-17 LAB — HEPATIC FUNCTION PANEL
ALT: 25 U/L (ref 0–53)
AST: 21 U/L (ref 0–37)
Albumin: 4 g/dL (ref 3.5–5.2)
Alkaline Phosphatase: 67 U/L (ref 39–117)
Bilirubin, Direct: 0.1 mg/dL (ref 0.0–0.3)
Indirect Bilirubin: 0.5 mg/dL (ref 0.2–1.2)
Total Bilirubin: 0.6 mg/dL (ref 0.2–1.2)
Total Protein: 6.8 g/dL (ref 6.0–8.3)

## 2013-12-17 LAB — LIPID PANEL
Cholesterol: 141 mg/dL (ref 0–200)
HDL: 35 mg/dL — ABNORMAL LOW (ref >39)
LDL Cholesterol: 84 mg/dL (ref 0–99)
Total CHOL/HDL Ratio: 4 Ratio
Triglycerides: 112 mg/dL (ref <150)
VLDL: 22 mg/dL (ref 0–40)

## 2013-12-20 LAB — PSA: PSA: 1.03 ng/mL (ref <=4.00)

## 2013-12-20 NOTE — Progress Notes (Signed)
Patient notified and verbalized understanding of the test results. No further questions. (Will check in a couple of days to make sure the bacteria is sensitive to Cipro). Pt has appt on Aug 11.

## 2013-12-23 ENCOUNTER — Telehealth: Payer: Self-pay | Admitting: *Deleted

## 2013-12-23 NOTE — Telephone Encounter (Signed)
Pt seen for UTI. Taking cipro. Dr. Nicki Reaper asked [place] to follow up on urine culture when it came in. Bacteria not sensitive to cipro. Please advise

## 2013-12-23 NOTE — Telephone Encounter (Signed)
Error. Bacteria is sensitive to cipro.

## 2014-01-03 ENCOUNTER — Ambulatory Visit (INDEPENDENT_AMBULATORY_CARE_PROVIDER_SITE_OTHER): Payer: BC Managed Care – PPO | Admitting: Family Medicine

## 2014-01-03 ENCOUNTER — Encounter: Payer: Self-pay | Admitting: Family Medicine

## 2014-01-03 VITALS — BP 138/90 | Ht 71.0 in | Wt 242.0 lb

## 2014-01-03 DIAGNOSIS — I10 Essential (primary) hypertension: Secondary | ICD-10-CM

## 2014-01-03 DIAGNOSIS — R7301 Impaired fasting glucose: Secondary | ICD-10-CM

## 2014-01-03 DIAGNOSIS — N390 Urinary tract infection, site not specified: Secondary | ICD-10-CM

## 2014-01-03 DIAGNOSIS — J302 Other seasonal allergic rhinitis: Secondary | ICD-10-CM

## 2014-01-03 DIAGNOSIS — J309 Allergic rhinitis, unspecified: Secondary | ICD-10-CM

## 2014-01-03 LAB — POCT URINALYSIS DIPSTICK
Protein, UA: 30
Spec Grav, UA: 1.025
pH, UA: 5

## 2014-01-03 MED ORDER — LEVOFLOXACIN 500 MG PO TABS: 500.0000 mg | ORAL_TABLET | Freq: Every day | ORAL | Status: AC

## 2014-01-03 MED ORDER — LOSARTAN POTASSIUM 100 MG PO TABS: 100.0000 mg | ORAL_TABLET | Freq: Every day | ORAL | Status: DC

## 2014-01-03 NOTE — Progress Notes (Signed)
Subjective:    Patient ID: Oscar Campbell, male    DOB: 07-11-1955, 58 y.o.   MRN: [id number]  HPI Comments: Wants to go over BW results.   Hypertension This is a chronic problem. The current episode started more than 1 year ago. There are no compliance problems.    Needs refill on Losartan.compliant with bp meds. No obvious side effects from medicine. Watching salt intake. Walking some but not a lot.  Please see prior note. Still experiencing occasional dysuria but significantly improved. No hesitancy. See urine culture results.  Not exercising much   Results for orders placed in visit on 12/15/13  URINE CULTURE      Result Value Ref Range   Culture ENTEROCOCCUS SPECIES     Colony Count 70,000 COLONIES/ML     Organism ID, Bacteria ENTEROCOCCUS SPECIES    PSA      Result Value Ref Range   PSA 1.03  <=4.00 ng/mL  BASIC METABOLIC PANEL      Result Value Ref Range   Sodium 139  135 - 145 mEq/L   Potassium 4.0  3.5 - 5.3 mEq/L   Chloride 105  96 - 112 mEq/L   CO2 25  19 - 32 mEq/L   Glucose, Bld 102 (*) 70 - 99 mg/dL   BUN 12  6 - 23 mg/dL   Creat 0.89  0.50 - 1.35 mg/dL   Calcium 8.9  8.4 - 10.5 mg/dL  HEPATIC FUNCTION PANEL      Result Value Ref Range   Total Bilirubin 0.6  0.2 - 1.2 mg/dL   Bilirubin, Direct 0.1  0.0 - 0.3 mg/dL   Indirect Bilirubin 0.5  0.2 - 1.2 mg/dL   Alkaline Phosphatase 67  39 - 117 U/L   AST 21  0 - 37 U/L   ALT 25  0 - 53 U/L   Total Protein 6.8  6.0 - 8.3 g/dL   Albumin 4.0  3.5 - 5.2 g/dL  CBC WITH DIFFERENTIAL      Result Value Ref Range   WBC 7.6  4.0 - 10.5 K/uL   RBC 4.89  4.22 - 5.81 MIL/uL   Hemoglobin 14.7  13.0 - 17.0 g/dL   HCT 42.9  39.0 - 52.0 %   MCV 87.7  78.0 - 100.0 fL   MCH 30.1  26.0 - 34.0 pg   MCHC 34.3  30.0 - 36.0 g/dL   RDW 14.2  11.5 - 15.5 %   Platelets 176  150 - 400 K/uL   Neutrophils Relative % 62  43 - 77 %   Neutro Abs 4.7  1.7 - 7.7 K/uL   Lymphocytes Relative 26  12 - 46 %   Lymphs Abs 2.0  0.7 - 4.0  K/uL   Monocytes Relative 8  3 - 12 %   Monocytes Absolute 0.6  0.1 - 1.0 K/uL   Eosinophils Relative 3  0 - 5 %   Eosinophils Absolute 0.2  0.0 - 0.7 K/uL   Basophils Relative 1  0 - 1 %   Basophils Absolute 0.1  0.0 - 0.1 K/uL   Smear Review SEE NOTE    LIPID PANEL      Result Value Ref Range   Cholesterol 141  0 - 200 mg/dL   Triglycerides 112  <150 mg/dL   HDL 35 (*) >39 mg/dL   Total CHOL/HDL Ratio 4.0     VLDL 22  0 - 40 mg/dL   LDL Cholesterol  84  0 - 99 mg/dL  POCT URINALYSIS DIPSTICK      Result Value Ref Range   Color, UA       Clarity, UA       Glucose, UA       Bilirubin, UA       Ketones, UA       Spec Grav, UA >=1.030     Blood, UA 250     pH, UA 5.0     Protein, UA 30     Urobilinogen, UA       Nitrite, UA       Leukocytes, UA large (3+)       Results for orders placed in visit on 01/03/14  POCT URINALYSIS DIPSTICK      Result Value Ref Range   Color, UA       Clarity, UA       Glucose, UA       Bilirubin, UA       Ketones, UA       Spec Grav, UA 1.025     Blood, UA       pH, UA 5.0     Protein, UA 30     Urobilinogen, UA       Nitrite, UA       Leukocytes, UA large (3+)      Review of Systems No headache no chest pain no back pain no abdominal pain no change in bowel habits no blood in stool    Objective:   Physical Exam Alert no apparent distress. HEENT mom his congestion pharynx normal neck supple. Lungs clear. Heart regular in rhythm. Abdomen benign.  Current urinalysis no red blood cells. 6-8 white blood cells per high-power field.       Assessment & Plan:  Impression 1 persistent urinary tract infection with enterococcus etiology last time. #2 hypertension good control discussed #3 allergic rhinitis stable #4 impaired fasting glucose only slight elevation at this time. plan maintain antihistamine as a steroid spray. Avoidance measures discussed. Refill blood pressure medication. Diet exercise discussed. Levaquin 1 protracted round.  Recheck if any persistent urinary symptoms. Afterwards. WSL

## 2014-07-11 ENCOUNTER — Ambulatory Visit (INDEPENDENT_AMBULATORY_CARE_PROVIDER_SITE_OTHER): Payer: BLUE CROSS/BLUE SHIELD | Admitting: Family Medicine

## 2014-07-11 ENCOUNTER — Encounter: Payer: Self-pay | Admitting: Family Medicine

## 2014-07-11 VITALS — BP 132/88 | Ht 71.0 in | Wt 246.0 lb

## 2014-07-11 DIAGNOSIS — R5383 Other fatigue: Secondary | ICD-10-CM

## 2014-07-11 DIAGNOSIS — I10 Essential (primary) hypertension: Secondary | ICD-10-CM

## 2014-07-11 DIAGNOSIS — R7301 Impaired fasting glucose: Secondary | ICD-10-CM

## 2014-07-11 DIAGNOSIS — G5602 Carpal tunnel syndrome, left upper limb: Secondary | ICD-10-CM

## 2014-07-11 MED ORDER — LOSARTAN POTASSIUM 100 MG PO TABS: 100.0000 mg | ORAL_TABLET | Freq: Every day | ORAL | Status: DC

## 2014-07-11 NOTE — Progress Notes (Signed)
   Subjective:    Patient ID: Oscar Campbell, male    DOB: [birth year], 59 y.o.   MRN: [id number]  HPI Pt is here today for a 6 month check up.  He wants to lose weight, but having trouble. Wants to lose about 50 lbs. admits to dietary noncompliance. Admits to exercise noncompliance.  Feeling sluggish for the past 3 weeks. Not getting the best sleep. Pt remarried.   Pain, numbness from elbow to hand for a week now.  Numbness in hand worse at night wakes up with it  Right hand ed  Using left hand a lot.  Claims compliance with blood pressure medication. Watching salt intake. No obvious side effects from medicine.   Eats there often as far as work.  Drinks unsweetne tea and splenda and  Watches his diet off-and-on.     Review of Systems No headache no chest pain no back pain no abdominal pain no change in bowel habits no blood in stool ROS otherwise negative    Objective:   Physical Exam  Alert no acute distress HEENT normal lungs clear heart rare rhythm blood pressure good on repeat(plus minus Tinel sign left wrist negative Phalen sign      Assessment & Plan:  Impression 1 hypertension good control #2 fatigue discussed #3 allergic rhinitis ongoing #4 probable carpal tunnel syndrome discussed plan nighttime splints. Diet exercise discussed. Recheck as scheduled. Blood work then. WSL

## 2014-08-02 ENCOUNTER — Encounter: Payer: Self-pay | Admitting: Family Medicine

## 2014-08-02 ENCOUNTER — Ambulatory Visit (INDEPENDENT_AMBULATORY_CARE_PROVIDER_SITE_OTHER): Payer: BLUE CROSS/BLUE SHIELD | Admitting: Family Medicine

## 2014-08-02 VITALS — BP 132/90 | Temp 98.9°F | Ht 71.0 in | Wt 237.0 lb

## 2014-08-02 DIAGNOSIS — R300 Dysuria: Secondary | ICD-10-CM | POA: Diagnosis not present

## 2014-08-02 DIAGNOSIS — N3000 Acute cystitis without hematuria: Secondary | ICD-10-CM

## 2014-08-02 DIAGNOSIS — R1031 Right lower quadrant pain: Secondary | ICD-10-CM | POA: Diagnosis not present

## 2014-08-02 LAB — POCT URINALYSIS DIPSTICK
Blood, UA: POSITIVE
Protein, UA: POSITIVE
Spec Grav, UA: 1.025
pH, UA: 5

## 2014-08-02 MED ORDER — CIPROFLOXACIN HCL 500 MG PO TABS
500.0000 mg | ORAL_TABLET | Freq: Two times a day (BID) | ORAL | Status: DC
Start: 2014-08-02 — End: 2014-08-28

## 2014-08-02 MED ORDER — LOSARTAN POTASSIUM 100 MG PO TABS: 100.0000 mg | ORAL_TABLET | Freq: Every day | ORAL | Status: DC

## 2014-08-02 NOTE — Progress Notes (Signed)
   Subjective:    Patient ID: Oscar Campbell, male    DOB: [birth year], 59 y.o.   MRN: [id number]  Back Pain This is a new problem. Episode onset: 4 days ago. The problem occurs 2 to 4 times per day. The problem has been gradually worsening since onset. The quality of the pain is described as cramping. The symptoms are aggravated by twisting. Associated symptoms include abdominal pain and pelvic pain. Treatments tried: Tylenol. The treatment provided mild relief.   Had intestinal virus last week  Severe pain right flank along with urinary symptoms   Review of Systems  Gastrointestinal: Positive for abdominal pain.  Genitourinary: Positive for pelvic pain.  Musculoskeletal: Positive for back pain.   patient states when he is doing well his urination flow is doing good. He denies flank or pelvic pain when he is doing well. He did have you UTI last July     Objective:   Physical Exam  Patient relates some pain in the lower abdomen area some radiates toward right flank he relates mild pelvic pain. He also states urinary symptoms of frequency. Occasional burning. Foul odor to the urine.  UA with WBCs. Also with bacteria. Cloudy.    Assessment & Plan:  UTI-probably associated with BPH recommend Cipro 500 mg twice a day for 3 weeks. I recommend the patient follow-up in approximately 4 weeks and have his urine rechecked meant to be certain that it is cleared of all cells.  If a reoccurring nature of this then consider referral to urology

## 2014-08-28 ENCOUNTER — Encounter: Payer: Self-pay | Admitting: Family Medicine

## 2014-08-28 ENCOUNTER — Ambulatory Visit (INDEPENDENT_AMBULATORY_CARE_PROVIDER_SITE_OTHER): Payer: BLUE CROSS/BLUE SHIELD | Admitting: Family Medicine

## 2014-08-28 VITALS — BP 138/92 | Temp 98.3°F | Ht 71.0 in | Wt 237.0 lb

## 2014-08-28 DIAGNOSIS — M67432 Ganglion, left wrist: Secondary | ICD-10-CM | POA: Diagnosis not present

## 2014-08-28 DIAGNOSIS — M545 Low back pain, unspecified: Secondary | ICD-10-CM

## 2014-08-28 DIAGNOSIS — I10 Essential (primary) hypertension: Secondary | ICD-10-CM

## 2014-08-28 LAB — POCT URINALYSIS DIPSTICK
Blood, UA: 250
Spec Grav, UA: 1.02
pH, UA: 5

## 2014-08-28 MED ORDER — HYDROCHLOROTHIAZIDE 25 MG PO TABS: ORAL_TABLET | ORAL | Status: DC

## 2014-08-28 NOTE — Progress Notes (Signed)
   Subjective:    Patient ID: Oscar Campbell, male    DOB: November 03, 1955, 59 y.o.   MRN: [id number]  HPIFollow up on UTI. Seen on 3/9 by Dr. Nicki Reaper. Finished cipro on march 30th. Still having back pain on right side. Had some low abd pain on Saturday. . Right-sided pain worse when twisting or moving.  BP last Thursday 175/103. Taking losartan every n blood pressures often elevated. A lot of stress at work lately. ight.  Pt brought in bp readings from nurse at work.   Cyst on left wrist. Came up a couple weeks ago. Recalls no injury.  Also known carpal tunnel syndrome. Improving with nocturnal splints.  Review of Systems No headache no chest pain no back pain no dysuria no change in frequency in urination improved    Objective:   Physical Exam Alert 152/90 on repeat HEENT normal. Lungs clear heart regular in rhythm ankles.edema left wrist ganglion cyst       Assessment & Plan:  Impression 1 hypertension suboptimal #2 urinary tract infection resolved UA good today #3 right flank pain musculoskeletal discussed #4 ganglionic cyst tendon discussed and add Hydrocort thiazide 12.5 daily. Follow-up as scheduled. Diet exercise discussed WSL

## 2014-08-29 ENCOUNTER — Ambulatory Visit: Payer: BLUE CROSS/BLUE SHIELD | Admitting: Family Medicine

## 2014-12-27 ENCOUNTER — Other Ambulatory Visit: Payer: Self-pay | Admitting: *Deleted

## 2014-12-27 ENCOUNTER — Telehealth: Payer: Self-pay | Admitting: Family Medicine

## 2014-12-27 DIAGNOSIS — Z79899 Other long term (current) drug therapy: Secondary | ICD-10-CM

## 2014-12-27 DIAGNOSIS — Z13220 Encounter for screening for lipoid disorders: Secondary | ICD-10-CM

## 2014-12-27 DIAGNOSIS — Z125 Encounter for screening for malignant neoplasm of prostate: Secondary | ICD-10-CM

## 2014-12-27 DIAGNOSIS — I10 Essential (primary) hypertension: Secondary | ICD-10-CM

## 2014-12-27 DIAGNOSIS — Z7982 Long term (current) use of aspirin: Secondary | ICD-10-CM

## 2014-12-27 DIAGNOSIS — R5383 Other fatigue: Secondary | ICD-10-CM

## 2014-12-27 NOTE — Telephone Encounter (Signed)
Rep same, probably psa is delayed a bit but should be in by then

## 2014-12-27 NOTE — Telephone Encounter (Signed)
Patient has a physical on 01/09/2015.  He wants to have BW ordered.  He also wants to know if he goes to have his BW done on 01/06/2015, would the results be back in time?

## 2014-12-27 NOTE — Telephone Encounter (Signed)
Last labs 12/15/13 Lipid, CBC, Liver, BMP, PSA

## 2014-12-27 NOTE — Telephone Encounter (Signed)
Left message on voicemail notifying patient that blood work has been ordered.  

## 2014-12-27 NOTE — Addendum Note (Signed)
Addended by: Jesusita Oka on: 12/27/2014 04:09 PM   Modules accepted: Orders

## 2015-01-08 LAB — CBC WITH DIFFERENTIAL/PLATELET
Basophils Absolute: 0.1 10*3/uL (ref 0.0–0.2)
Basos: 1 %
EOS (ABSOLUTE): 0.2 10*3/uL (ref 0.0–0.4)
Eos: 3 %
Hematocrit: 45 % (ref 37.5–51.0)
Hemoglobin: 15.3 g/dL (ref 12.6–17.7)
Immature Grans (Abs): 0.1 10*3/uL (ref 0.0–0.1)
Immature Granulocytes: 2 %
Lymphocytes Absolute: 2.2 10*3/uL (ref 0.7–3.1)
Lymphs: 28 %
MCH: 30.9 pg (ref 26.6–33.0)
MCHC: 34 g/dL (ref 31.5–35.7)
MCV: 91 fL (ref 79–97)
Monocytes Absolute: 0.7 10*3/uL (ref 0.1–0.9)
Monocytes: 9 %
Neutrophils Absolute: 4.5 10*3/uL (ref 1.4–7.0)
Neutrophils: 58 %
Platelets: 186 10*3/uL (ref 150–379)
RBC: 4.95 x10E6/uL (ref 4.14–5.80)
RDW: 14.4 % (ref 12.3–15.4)
WBC: 7.8 10*3/uL (ref 3.4–10.8)

## 2015-01-08 LAB — LIPID PANEL
Chol/HDL Ratio: 3.9 ratio units (ref 0.0–5.0)
Cholesterol, Total: 147 mg/dL (ref 100–199)
HDL: 38 mg/dL — ABNORMAL LOW (ref >39)
LDL Calculated: 81 mg/dL (ref 0–99)
Triglycerides: 139 mg/dL (ref 0–149)
VLDL Cholesterol Cal: 28 mg/dL (ref 5–40)

## 2015-01-08 LAB — HEPATIC FUNCTION PANEL
ALT: 30 IU/L (ref 0–44)
AST: 24 IU/L (ref 0–40)
Albumin: 4 g/dL (ref 3.5–5.5)
Alkaline Phosphatase: 75 IU/L (ref 39–117)
Bilirubin Total: 0.6 mg/dL (ref 0.0–1.2)
Bilirubin, Direct: 0.18 mg/dL (ref 0.00–0.40)
Total Protein: 6.5 g/dL (ref 6.0–8.5)

## 2015-01-08 LAB — BASIC METABOLIC PANEL
BUN/Creatinine Ratio: 19 (ref 9–20)
BUN: 14 mg/dL (ref 6–24)
CO2: 24 mmol/L (ref 18–29)
Calcium: 9 mg/dL (ref 8.7–10.2)
Chloride: 102 mmol/L (ref 97–108)
Creatinine, Ser: 0.74 mg/dL — ABNORMAL LOW (ref 0.76–1.27)
GFR calc Af Amer: 118 mL/min/{1.73_m2} (ref >59)
GFR calc non Af Amer: 102 mL/min/{1.73_m2} (ref >59)
Glucose: 117 mg/dL — ABNORMAL HIGH (ref 65–99)
Potassium: 4.1 mmol/L (ref 3.5–5.2)
Sodium: 141 mmol/L (ref 134–144)

## 2015-01-08 LAB — PSA: Prostate Specific Ag, Serum: 0.9 ng/mL (ref 0.0–4.0)

## 2015-01-09 ENCOUNTER — Encounter: Payer: Self-pay | Admitting: Family Medicine

## 2015-01-09 ENCOUNTER — Ambulatory Visit (INDEPENDENT_AMBULATORY_CARE_PROVIDER_SITE_OTHER): Payer: BLUE CROSS/BLUE SHIELD | Admitting: Family Medicine

## 2015-01-09 VITALS — BP 120/80 | Ht 71.0 in | Wt 239.0 lb

## 2015-01-09 DIAGNOSIS — I10 Essential (primary) hypertension: Secondary | ICD-10-CM | POA: Diagnosis not present

## 2015-01-09 DIAGNOSIS — Z0000 Encounter for general adult medical examination without abnormal findings: Secondary | ICD-10-CM | POA: Diagnosis not present

## 2015-01-09 NOTE — Progress Notes (Signed)
Subjective:    Patient ID: Oscar Campbell, male    DOB: [birth year], 59 y.o.   MRN: [id number]  HPI  The patient comes in today for a wellness visit.  2009 colonoscopy , advised then to do in five yrs, pt has declined thus far   A review of their health history was completed.  A review of medications was also completed.  Any needed refills; yes Eating habits: good Falls/  MVA accidents in past few months: None  Regular exercise: Yes  Specialist pt sees on regular basis: None  Preventative health issues were discussed.   Additional concerns:  Patient states concerns no this visit  [place] per wk, got a way fom it  Compliant with blood pressure medicine. Watching salt intake. Blood pressure generally good when checked elsewhere.  Results for orders placed or performed in visit on 12/27/14  Lipid panel  Result Value Ref Range   Cholesterol, Total 147 100 - 199 mg/dL   Triglycerides 139 0 - 149 mg/dL   HDL 38 (L) >39 mg/dL   VLDL Cholesterol Cal 28 5 - 40 mg/dL   LDL Calculated 81 0 - 99 mg/dL   Chol/HDL Ratio 3.9 0.0 - 5.0 ratio units  CBC with Differential/Platelet  Result Value Ref Range   WBC 7.8 3.4 - 10.8 x10E3/uL   RBC 4.95 4.14 - 5.80 x10E6/uL   Hemoglobin 15.3 12.6 - 17.7 g/dL   Hematocrit 45.0 37.5 - 51.0 %   MCV 91 79 - 97 fL   MCH 30.9 26.6 - 33.0 pg   MCHC 34.0 31.5 - 35.7 g/dL   RDW 14.4 12.3 - 15.4 %   Platelets 186 150 - 379 x10E3/uL   Neutrophils 58 %   Lymphs 28 %   Monocytes 9 %   Eos 3 %   Basos 1 %   Neutrophils Absolute 4.5 1.4 - 7.0 x10E3/uL   Lymphocytes Absolute 2.2 0.7 - 3.1 x10E3/uL   Monocytes Absolute 0.7 0.1 - 0.9 x10E3/uL   EOS (ABSOLUTE) 0.2 0.0 - 0.4 x10E3/uL   Basophils Absolute 0.1 0.0 - 0.2 x10E3/uL   Immature Granulocytes 2 %   Immature Grans (Abs) 0.1 0.0 - 0.1 x10E3/uL  Hepatic function panel  Result Value Ref Range   Total Protein 6.5 6.0 - 8.5 g/dL   Albumin 4.0 3.5 - 5.5  g/dL   Bilirubin Total 0.6 0.0 - 1.2 mg/dL   Bilirubin, Direct 0.18 0.00 - 0.40 mg/dL   Alkaline Phosphatase 75 39 - 117 IU/L   AST 24 0 - 40 IU/L   ALT 30 0 - 44 IU/L  Basic metabolic panel  Result Value Ref Range   Glucose 117 (H) 65 - 99 mg/dL   BUN 14 6 - 24 mg/dL   Creatinine, Ser 0.74 (L) 0.76 - 1.27 mg/dL   GFR calc non Af Amer 102 >59 mL/min/1.73   GFR calc Af Amer 118 >59 mL/min/1.73   BUN/Creatinine Ratio 19 9 - 20   Sodium 141 134 - 144 mmol/L   Potassium 4.1 3.5 - 5.2 mmol/L   Chloride 102 97 - 108 mmol/L   CO2 24 18 - 29 mmol/L   Calcium 9.0 8.7 - 10.2 mg/dL  PSA  Result Value Ref Range   Prostate Specific Ag, Serum 0.9 0.0 - 4.0 ng/mL     Review of Systems  Constitutional: Negative for fever, activity change and appetite change.  HENT: Negative for  congestion and rhinorrhea.   Eyes: Negative for discharge.  Respiratory: Negative for cough and wheezing.   Cardiovascular: Negative for chest pain.  Gastrointestinal: Negative for vomiting, abdominal pain and blood in stool.  Genitourinary: Negative for frequency and difficulty urinating.  Musculoskeletal: Negative for neck pain.  Skin: Negative for rash.  Allergic/Immunologic: Negative for environmental allergies and food allergies.  Neurological: Negative for weakness and headaches.  Psychiatric/Behavioral: Negative for agitation.  All other systems reviewed and are negative.      Objective:   Physical Exam  Constitutional: He appears well-developed and well-nourished.  HENT:  Head: Normocephalic and atraumatic.  Right Ear: External ear normal.  Left Ear: External ear normal.  Nose: Nose normal.  Mouth/Throat: Oropharynx is clear and moist.  Eyes: EOM are normal. Pupils are equal, round, and reactive to light.  Neck: Normal range of motion. Neck supple. No thyromegaly present.  Cardiovascular: Normal rate, regular rhythm and normal heart sounds.   No murmur heard. Pulmonary/Chest: Effort normal and  breath sounds normal. No respiratory distress. He has no wheezes.  Abdominal: Soft. Bowel sounds are normal. He exhibits no distension and no mass. There is no tenderness.  Genitourinary: Penis normal.  Musculoskeletal: Normal range of motion. He exhibits no edema.  Lymphadenopathy:    He has no cervical adenopathy.  Neurological: He is alert. He exhibits normal muscle tone.  Skin: Skin is warm and dry. No erythema.  Psychiatric: He has a normal mood and affect. His behavior is normal. Judgment normal.  Vitals reviewed.         Assessment & Plan:  Impression #1 well at all exam #2: Status patient strongly encouraged to call and set up his GI assessment. Due over do in fact for colonoscopy #3 hypertension good control discussed plan maintain same medications. Diet exercise discussed. Blood work discussed. Recheck in 6 months. WSL

## 2015-01-22 ENCOUNTER — Telehealth: Payer: Self-pay

## 2015-01-22 NOTE — Telephone Encounter (Signed)
Pt had called and said Dr. Wolfgang Phoenix referred him for his next colonoscopy. His last was 09/17/2007 by Dr. Gala Romney. Next was recommended in 5 years. He is past due. He has hx of tubular adenoma.  LMOM that he will need OV prior to scheduling the colonoscopy.

## 2015-01-24 NOTE — Telephone Encounter (Signed)
Letter mailed to pt to call and schedule an ov prior to scheduling colonoscopy.

## 2015-02-12 ENCOUNTER — Other Ambulatory Visit: Payer: Self-pay

## 2015-02-12 ENCOUNTER — Encounter: Payer: Self-pay | Admitting: Gastroenterology

## 2015-02-12 ENCOUNTER — Ambulatory Visit (INDEPENDENT_AMBULATORY_CARE_PROVIDER_SITE_OTHER): Payer: BLUE CROSS/BLUE SHIELD | Admitting: Gastroenterology

## 2015-02-12 VITALS — BP 143/78 | HR 88 | Temp 97.4°F | Ht 71.0 in | Wt 239.6 lb

## 2015-02-12 DIAGNOSIS — Z86010 Personal history of colonic polyps: Secondary | ICD-10-CM | POA: Diagnosis not present

## 2015-02-12 DIAGNOSIS — Z860101 Personal history of adenomatous and serrated colon polyps: Secondary | ICD-10-CM | POA: Insufficient documentation

## 2015-02-12 MED ORDER — PEG 3350-KCL-NA BICARB-NACL 420 G PO SOLR: 4000.0000 mL | Freq: Once | ORAL | Status: DC

## 2015-02-12 NOTE — Patient Instructions (Signed)
We have scheduled you for a colonoscopy with Dr. Rourk in the near future.  Further recommendations to follow!   

## 2015-02-12 NOTE — Progress Notes (Signed)
Primary Care Physician:  Mickie Hillier, MD Primary Gastroenterologist:  Dr. Gala Romney   Chief Complaint  Patient presents with  . Colonoscopy    HPI:   Oscar Campbell is a 59 y.o. male presenting today for an office visit prior to surveillance colonoscopy due to history of colonic adenoma on last colonoscopy in 2009.   Has RLQ discomfort that radiates around lower abdomen to LLQ. Had blood in urine. Placed on antibiotics. Will last a few seconds then go away. No constipation or diarrhea. No unexplained weight loss or lack of appetite. No rectal bleeding. No upper GI symptoms.   Past Medical History  Diagnosis Date  . Hypertension   . Obesity   . Erectile dysfunction   . Elevated PSA   . Impaired fasting glucose   . Seasonal rhinitis     Past Surgical History  Procedure Laterality Date  . Cardiovascular stress test    . Colonoscopy  2009    Dr. Gala Romney: tubular adenoma    Current Outpatient Prescriptions  Medication Sig Dispense Refill  . aspirin EC 81 MG tablet Take 81 mg by mouth daily.      . hydrochlorothiazide (HYDRODIURIL) 25 MG tablet One half tab q am 45 tablet 3  . losartan (COZAAR) 100 MG tablet Take 1 tablet (100 mg total) by mouth daily. 90 tablet 1  . polyethylene glycol-electrolytes (NULYTELY/GOLYTELY) 420 G solution Take 4,000 mLs by mouth once. 4000 mL 0   No current facility-administered medications for this visit.    Allergies as of 02/12/2015 - Review Complete 02/12/2015  Allergen Reaction Noted  . Penicillins Rash 01/20/2011    Family History  Problem Relation Age of Onset  . Heart disease Father   . Colon cancer Neg Hx     Social History   Social History  . Marital Status: Divorced    Spouse Name: N/A  . Number of Children: N/A  . Years of Education: N/A   Occupational History  . Not on file.   Social History Main Topics  . Smoking status: Never Smoker   . Smokeless tobacco: Not on file  . Alcohol Use: No  . Drug Use: No  .  Sexual Activity: Yes   Other Topics Concern  . Not on file   Social History Narrative    Review of Systems: Gen: Denies any fever, chills, fatigue, weight loss, lack of appetite.  CV: Denies chest pain, heart palpitations, peripheral edema, syncope.  Resp: Denies shortness of breath at rest or with exertion. Denies wheezing or cough.  GI: Negative unless mentioned in HPI  GU : Denies urinary burning, urinary frequency, urinary hesitancy MS: Denies joint pain, muscle weakness, cramps, or limitation of movement.  Derm: Denies rash, itching, dry skin Psych: Denies depression, anxiety, memory loss, and confusion Heme: Denies bruising, bleeding, and enlarged lymph nodes.  Physical Exam: BP 143/78 mmHg  Pulse 88  Temp(Src) 97.4 F (36.3 C) (Oral)  Ht 5\' 11"  (1.803 m)  Wt 239 lb 9.6 oz (108.682 kg)  BMI 33.43 kg/m2 General:   Alert and oriented. Pleasant and cooperative. Well-nourished and well-developed.  Head:  Normocephalic and atraumatic. Eyes:  Without icterus, sclera clear and conjunctiva pink.  Ears:  Normal auditory acuity. Nose:  No deformity, discharge,  or lesions. Mouth:  No deformity or lesions, oral mucosa pink.  Lungs:  Clear to auscultation bilaterally. No wheezes, rales, or rhonchi. No distress.  Heart:  S1, S2 present without murmurs appreciated.  Abdomen:  +  BS, soft, non-tender and non-distended. No HSM noted. No guarding or rebound. No masses appreciated.  Rectal:  Deferred  Msk:  Symmetrical without gross deformities. Normal posture Extremities:  Without  edema. Neurologic:  Alert and  oriented x4;  grossly normal neurologically. Psych:  Alert and cooperative. Normal mood and affect.

## 2015-02-13 ENCOUNTER — Encounter: Payer: Self-pay | Admitting: Gastroenterology

## 2015-02-13 NOTE — Progress Notes (Signed)
CC'ED TO PCP 

## 2015-02-13 NOTE — Assessment & Plan Note (Addendum)
59 year old male with history of colonic adenoma in 2009, due for routine surveillance now. Reports vague, fleeting abdominal discomfort at times of unknown etiology. He has no other concerning lower GI or upper GI symptoms. Monitor for now.   Proceed with TCS with Dr. Gala Romney in near future: the risks, benefits, and alternatives have been discussed with the patient in detail. The patient states understanding and desires to proceed. Phenergan 25 mg IV on call (patient states he thinks he remembers procedure last time). Will hold on Propofol as he has no history of ETOH use or polypharmacy.

## 2015-02-28 ENCOUNTER — Other Ambulatory Visit: Payer: Self-pay

## 2015-03-02 ENCOUNTER — Encounter (HOSPITAL_COMMUNITY): Payer: Self-pay | Admitting: *Deleted

## 2015-03-02 ENCOUNTER — Ambulatory Visit (HOSPITAL_COMMUNITY)
Admission: RE | Admit: 2015-03-02 | Discharge: 2015-03-02 | Disposition: A | Discharge status: Home / Self Care | Payer: BLUE CROSS/BLUE SHIELD | Source: Ambulatory Visit | Attending: Internal Medicine | Admitting: Internal Medicine

## 2015-03-02 ENCOUNTER — Encounter (HOSPITAL_COMMUNITY)
Admission: RE | Disposition: A | Discharge status: Home / Self Care | Payer: Self-pay | Source: Ambulatory Visit | Attending: Internal Medicine

## 2015-03-02 DIAGNOSIS — I10 Essential (primary) hypertension: Secondary | ICD-10-CM | POA: Insufficient documentation

## 2015-03-02 DIAGNOSIS — Z6833 Body mass index (BMI) 33.0-33.9, adult: Secondary | ICD-10-CM | POA: Insufficient documentation

## 2015-03-02 DIAGNOSIS — Z1211 Encounter for screening for malignant neoplasm of colon: Secondary | ICD-10-CM | POA: Insufficient documentation

## 2015-03-02 DIAGNOSIS — K635 Polyp of colon: Secondary | ICD-10-CM

## 2015-03-02 DIAGNOSIS — Z7982 Long term (current) use of aspirin: Secondary | ICD-10-CM | POA: Insufficient documentation

## 2015-03-02 DIAGNOSIS — K5730 Diverticulosis of large intestine without perforation or abscess without bleeding: Secondary | ICD-10-CM | POA: Diagnosis not present

## 2015-03-02 DIAGNOSIS — Z79899 Other long term (current) drug therapy: Secondary | ICD-10-CM | POA: Diagnosis not present

## 2015-03-02 DIAGNOSIS — Z86010 Personal history of colonic polyps: Secondary | ICD-10-CM

## 2015-03-02 DIAGNOSIS — D127 Benign neoplasm of rectosigmoid junction: Secondary | ICD-10-CM | POA: Diagnosis not present

## 2015-03-02 DIAGNOSIS — E669 Obesity, unspecified: Secondary | ICD-10-CM | POA: Diagnosis not present

## 2015-03-02 HISTORY — PX: COLONOSCOPY: SHX5424

## 2015-03-02 SURGERY — COLONOSCOPY
Anesthesia: Moderate Sedation

## 2015-03-02 MED ORDER — PROMETHAZINE HCL 25 MG/ML IJ SOLN
25.0000 mg | Freq: Once | INTRAMUSCULAR | Status: AC
Start: 2015-03-02 — End: 2015-03-02
  Administered 2015-03-02: 25 mg via INTRAVENOUS

## 2015-03-02 MED ORDER — ONDANSETRON HCL 4 MG/2ML IJ SOLN
INTRAMUSCULAR | Status: DC | PRN
  Administered 2015-03-02: 4 mg via INTRAVENOUS

## 2015-03-02 MED ORDER — ONDANSETRON HCL 4 MG/2ML IJ SOLN
INTRAMUSCULAR | Status: AC
  Filled 2015-03-02: qty 2

## 2015-03-02 MED ORDER — MEPERIDINE HCL 100 MG/ML IJ SOLN
INTRAMUSCULAR | Status: DC
Start: 2015-03-02 — End: 2015-03-02
  Filled 2015-03-02: qty 2

## 2015-03-02 MED ORDER — STERILE WATER FOR IRRIGATION IR SOLN
Status: DC | PRN
  Administered 2015-03-02: 09:00:00

## 2015-03-02 MED ORDER — SODIUM CHLORIDE 0.9 % IV SOLN
INTRAVENOUS | Status: DC
  Administered 2015-03-02: 08:00:00 via INTRAVENOUS

## 2015-03-02 MED ORDER — MIDAZOLAM HCL 5 MG/5ML IJ SOLN
INTRAMUSCULAR | Status: DC
Start: 2015-03-02 — End: 2015-03-02
  Filled 2015-03-02: qty 10

## 2015-03-02 MED ORDER — MEPERIDINE HCL 100 MG/ML IJ SOLN
INTRAMUSCULAR | Status: DC | PRN
  Administered 2015-03-02: 50 mg via INTRAVENOUS
  Administered 2015-03-02: 25 mg via INTRAVENOUS

## 2015-03-02 MED ORDER — PROMETHAZINE HCL 25 MG/ML IJ SOLN
INTRAMUSCULAR | Status: AC
  Filled 2015-03-02: qty 1

## 2015-03-02 MED ORDER — MIDAZOLAM HCL 5 MG/5ML IJ SOLN
INTRAMUSCULAR | Status: DC | PRN
  Administered 2015-03-02 (×2): 2 mg via INTRAVENOUS
  Administered 2015-03-02 (×2): 1 mg via INTRAVENOUS

## 2015-03-02 MED ORDER — SIMETHICONE 40 MG/0.6ML PO SUSP
ORAL | Status: AC
  Filled 2015-03-02: qty 60

## 2015-03-02 MED ORDER — SODIUM CHLORIDE 0.9 % IJ SOLN
INTRAMUSCULAR | Status: AC
  Filled 2015-03-02: qty 3

## 2015-03-02 NOTE — Op Note (Signed)
[facility] [address] [place], [address]   COLONOSCOPY PROCEDURE REPORT  PATIENT: Oscar Campbell, Oscar Campbell  MR#: [id number] BIRTHDATE: [birth year] , 75  yrs. old GENDER: male ENDOSCOPIST: R.  Garfield Cornea, MD FACP [facility] REFERRED KP:[id number] Wolfgang Phoenix, M.D. PROCEDURE DATE:  2015-03-28 PROCEDURE:   Colonoscopy with snare polypectomy INDICATIONS:surveillance examination; history of colonic adenoma. MEDICATIONS: Versed 6 mg IV and Demerol 75 mg IV in divided doses. Xylocaine gel orally.  Zofran 4 mg IV. ASA CLASS:       Class II  CONSENT: The risks, benefits, alternatives and imponderables including but not limited to bleeding, perforation as well as the possibility of a missed lesion have been reviewed.  The potential for biopsy, lesion removal, etc. have also been discussed. Questions have been answered.  All parties agreeable.  Please see the history and physical in the medical record for more information.  DESCRIPTION OF PROCEDURE:   After the risks benefits and alternatives of the procedure were thoroughly explained, informed consent was obtained.  The digital rectal exam revealed no abnormalities of the rectum.   The EC-[id number] ([id number])  endoscope was introduced through the anus and advanced to the cecum, which was identified by both the appendix and ileocecal valve. No adverse events experienced.   The quality of the prep was adequate  The instrument was then slowly withdrawn as the colon was fully examined. Estimated blood loss is zero unless otherwise noted in this procedure report.      COLON FINDINGS: Normal-appearing rectal mucosa.  Scattered left-sided diverticula; (1) 5 mm polyp at the rectosigmoid junction; otherwise, the remainder of the colonic mucosa appeared normal.  The above-mentioned polyp was cold snare/removed.  Retroflexion was performed. .  Withdrawal time=11 minutes 0 seconds.  The scope was withdrawn and the procedure  completed. COMPLICATIONS: There were no immediate complications. EBL 3 mL ENDOSCOPIC IMPRESSION: Colonic diverticulosis. Single colonic polyp?"removed as described above.  RECOMMENDATIONS: Follow-up on pathology.  eSigned:  R. Garfield Cornea, MD Rosalita Chessman [facility] 2015/03/28 9:25 AM   cc:  CPT CODES: ICD CODES:  The ICD and CPT codes recommended by this software are interpretations from the data that the clinical staff has captured with the software.  The verification of the translation of this report to the ICD and CPT codes and modifiers is the sole responsibility of the health care institution and practicing physician where this report was generated.  [employer] will not be held responsible for the validity of the ICD and CPT codes included on this report.  AMA assumes no liability for data contained or not contained herein. CPT is a Designer, television/film set of the [employer].  PATIENT NAME:  Oscar Campbell, Oscar Campbell MR#: [id number]

## 2015-03-02 NOTE — Discharge Instructions (Signed)
Colonoscopy Discharge Instructions  Read the instructions outlined below and refer to this sheet in the next few weeks. These discharge instructions provide you with general information on caring for yourself after you leave the hospital. Your doctor may also give you specific instructions. While your treatment has been planned according to the most current medical practices available, unavoidable complications occasionally occur. If you have any problems or questions after discharge, call Dr. Gala Romney at [phone]. ACTIVITY  You may resume your regular activity, but move at a slower pace for the next 24 hours.   Take frequent rest periods for the next 24 hours.   Walking will help get rid of the air and reduce the bloated feeling in your belly (abdomen).   No driving for 24 hours (because of the medicine (anesthesia) used during the test).    Do not sign any important legal documents or operate any machinery for 24 hours (because of the anesthesia used during the test).  NUTRITION  Drink plenty of fluids.   You may resume your normal diet as instructed by your doctor.   Begin with a light meal and progress to your normal diet. Heavy or fried foods are harder to digest and may make you feel sick to your stomach (nauseated).   Avoid alcoholic beverages for 24 hours or as instructed.  MEDICATIONS  You may resume your normal medications unless your doctor tells you otherwise.  WHAT YOU CAN EXPECT TODAY  Some feelings of bloating in the abdomen.   Passage of more gas than usual.   Spotting of blood in your stool or on the toilet paper.  IF YOU HAD POLYPS REMOVED DURING THE COLONOSCOPY:  No aspirin products for 7 days or as instructed.   No alcohol for 7 days or as instructed.   Eat a soft diet for the next 24 hours.  FINDING OUT THE RESULTS OF YOUR TEST Not all test results are available during your visit. If your test results are not back during the visit, make an appointment  with your caregiver to find out the results. Do not assume everything is normal if you have not heard from your caregiver or the medical facility. It is important for you to follow up on all of your test results.  SEEK IMMEDIATE MEDICAL ATTENTION IF:  You have more than a spotting of blood in your stool.   Your belly is swollen (abdominal distention).   You are nauseated or vomiting.   You have a temperature over 101.   You have abdominal pain or discomfort that is severe or gets worse throughout the day.    Polyp and diverticulosis information provided  Further recommendations to follow pending review of pathology report  Colon Polyps Polyps are lumps of extra tissue growing inside the body. Polyps can grow in the large intestine (colon). Most colon polyps are noncancerous (benign). However, some colon polyps can become cancerous over time. Polyps that are larger than a pea may be harmful. To be safe, caregivers remove and test all polyps. CAUSES  Polyps form when mutations in the genes cause your cells to grow and divide even though no more tissue is needed. RISK FACTORS There are a number of risk factors that can increase your chances of getting colon polyps. They include:  Being older than 50 years.  Family history of colon polyps or colon cancer.  Long-term colon diseases, such as colitis or Crohn disease.  Being overweight.  Smoking.  Being inactive.  Drinking too much  body. Polyps can grow in the large intestine (colon). Most colon polyps are noncancerous (benign). However, some colon polyps can become cancerous over time. Polyps that are larger than a pea may be harmful. To be safe, caregivers remove and test all polyps. °CAUSES  °Polyps form when mutations in the genes cause your cells to grow and divide even though no more tissue is needed. °RISK FACTORS °There are a number of risk factors that can increase your chances of getting colon polyps. They include: °· Being older than 50 years. °· Family history of colon polyps or colon cancer. °· Long-term colon diseases, such as colitis or Crohn disease. °· Being overweight. °· Smoking. °· Being inactive. °· Drinking too much alcohol. °SYMPTOMS  °Most small polyps do not cause symptoms. If symptoms are present, they may include: °· Blood in the stool. The stool may look dark red or black. °· Constipation or diarrhea that lasts longer than 1 week. °DIAGNOSIS °People often do not know they have polyps until their caregiver finds them during a regular checkup. Your caregiver can use 4 tests to check for  polyps: °· Digital rectal exam. The caregiver wears gloves and feels inside the rectum. This test would find polyps only in the rectum. °· Barium enema. The caregiver puts a liquid called barium into your rectum before taking X-rays of your colon. Barium makes your colon look white. Polyps are dark, so they are easy to see in the X-ray pictures. °· Sigmoidoscopy. A thin, flexible tube (sigmoidoscope) is placed into your rectum. The sigmoidoscope has a light and tiny camera in it. The caregiver uses the sigmoidoscope to look at the last third of your colon. °· Colonoscopy. This test is like sigmoidoscopy, but the caregiver looks at the entire colon. This is the most common method for finding and removing polyps. °TREATMENT  °Any polyps will be removed during a sigmoidoscopy or colonoscopy. The polyps are then tested for cancer. °PREVENTION  °To help lower your risk of getting more colon polyps: °· Eat plenty of fruits and vegetables. Avoid eating fatty foods. °· Do not smoke. °· Avoid drinking alcohol. °· Exercise every day. °· Lose weight if recommended by your caregiver. °· Eat plenty of calcium and folate. Foods that are rich in calcium include milk, cheese, and broccoli. Foods that are rich in folate include chickpeas, kidney beans, and spinach. °HOME CARE INSTRUCTIONS °Keep all follow-up appointments as directed by your caregiver. You may need periodic exams to check for polyps. °SEEK MEDICAL CARE IF: °You notice bleeding during a bowel movement. °  °This information is not intended to replace advice given to you by your health care provider. Make sure you discuss any questions you have with your health care provider. °  °Document Released: 02/06/2004 Document Revised: 06/02/2014 Document Reviewed: 07/22/2011 °Elsevier Interactive Patient Education ©2016 Else[employer] ° ° ° ° ° ° ° °Diverticulosis °Diverticulosis is the condition that develops when small pouches (diverticula) form in the wall of your colon. Your  colon, or large intestine, is where water is absorbed and stool is formed. The pouches form when the inside layer of your colon pushes through weak spots in the outer layers of your colon. °CAUSES  °No one knows exactly what causes diverticulosis. °RISK FACTORS °· Being older than 50. Your risk for this condition increases with age. Diverticulosis is rare in people younger than 40 years. By age 80, almost everyone has it. °· Eating a low-fiber diet. °· Being frequently constipated. °· Being overweight. °·   6-8 glasses of water each day to prevent constipation.  Try not to strain when you have a bowel movement.  Keep all follow-up appointments. If you have had an infection before:  Increase the fiber in your diet as directed by your health care provider or dietitian.  Take a dietary fiber supplement if your health care provider approves.  Only take medicines as directed by your health care provider. SEEK MEDICAL CARE IF:   You have abdominal pain.  You have bloating.  You have cramps.  You have not gone to the bathroom in 3 days. SEEK IMMEDIATE MEDICAL CARE IF:   Your pain gets worse.  Yourbloating becomes very bad.  You have a fever or chills, and your symptoms suddenly get worse.  You begin  vomiting.  You have bowel movements that are bloody or black. MAKE SURE YOU:  Understand these instructions.  Will watch your condition.  Will get help right away if you are not doing well or get worse.   This information is not intended to replace advice given to you by your health care provider. Make sure you discuss any questions you have with your health care provider.   Document Released: 02/07/2004 Document Revised: 05/17/2013 Document Reviewed: 04/06/2013 Elsevier Interactive Patient Education [employer].

## 2015-03-02 NOTE — Interval H&P Note (Signed)
History and Physical Interval Note:  03/02/2015 8:32 AM  Oscar Campbell  has presented today for surgery, with the diagnosis of hx of colon polyps  The various methods of treatment have been discussed with the patient and family. After consideration of risks, benefits and other options for treatment, the patient has consented to  Procedure(s) with comments: COLONOSCOPY (N/A) - 0830 as a surgical intervention .  The patient's history has been reviewed, patient examined, no change in status, stable for surgery.  I have reviewed the patient's chart and labs.  Questions were answered to the patient's satisfaction.     Oscar Campbell  No change. TCS per plan. The risks, benefits, limitations, alternatives and imponderables have been reviewed with the patient. Questions have been answered. All parties are agreeable.

## 2015-03-02 NOTE — H&P (View-Only) (Signed)
Primary Care Physician:  Mickie Hillier, MD Primary Gastroenterologist:  Dr. Gala Romney   Chief Complaint  Patient presents with  . Colonoscopy    HPI:   Oscar Campbell is a 59 y.o. male presenting today for an office visit prior to surveillance colonoscopy due to history of colonic adenoma on last colonoscopy in 2009.   Has RLQ discomfort that radiates around lower abdomen to LLQ. Had blood in urine. Placed on antibiotics. Will last a few seconds then go away. No constipation or diarrhea. No unexplained weight loss or lack of appetite. No rectal bleeding. No upper GI symptoms.   Past Medical History  Diagnosis Date  . Hypertension   . Obesity   . Erectile dysfunction   . Elevated PSA   . Impaired fasting glucose   . Seasonal rhinitis     Past Surgical History  Procedure Laterality Date  . Cardiovascular stress test    . Colonoscopy  2009    Dr. Gala Romney: tubular adenoma    Current Outpatient Prescriptions  Medication Sig Dispense Refill  . aspirin EC 81 MG tablet Take 81 mg by mouth daily.      . hydrochlorothiazide (HYDRODIURIL) 25 MG tablet One half tab q am 45 tablet 3  . losartan (COZAAR) 100 MG tablet Take 1 tablet (100 mg total) by mouth daily. 90 tablet 1  . polyethylene glycol-electrolytes (NULYTELY/GOLYTELY) 420 G solution Take 4,000 mLs by mouth once. 4000 mL 0   No current facility-administered medications for this visit.    Allergies as of 02/12/2015 - Review Complete 02/12/2015  Allergen Reaction Noted  . Penicillins Rash 01/20/2011    Family History  Problem Relation Age of Onset  . Heart disease Father   . Colon cancer Neg Hx     Social History   Social History  . Marital Status: Divorced    Spouse Name: N/A  . Number of Children: N/A  . Years of Education: N/A   Occupational History  . Not on file.   Social History Main Topics  . Smoking status: Never Smoker   . Smokeless tobacco: Not on file  . Alcohol Use: No  . Drug Use: No  .  Sexual Activity: Yes   Other Topics Concern  . Not on file   Social History Narrative    Review of Systems: Gen: Denies any fever, chills, fatigue, weight loss, lack of appetite.  CV: Denies chest pain, heart palpitations, peripheral edema, syncope.  Resp: Denies shortness of breath at rest or with exertion. Denies wheezing or cough.  GI: Negative unless mentioned in HPI  GU : Denies urinary burning, urinary frequency, urinary hesitancy MS: Denies joint pain, muscle weakness, cramps, or limitation of movement.  Derm: Denies rash, itching, dry skin Psych: Denies depression, anxiety, memory loss, and confusion Heme: Denies bruising, bleeding, and enlarged lymph nodes.  Physical Exam: BP 143/78 mmHg  Pulse 88  Temp(Src) 97.4 F (36.3 C) (Oral)  Ht 5\' 11"  (1.803 m)  Wt 239 lb 9.6 oz (108.682 kg)  BMI 33.43 kg/m2 General:   Alert and oriented. Pleasant and cooperative. Well-nourished and well-developed.  Head:  Normocephalic and atraumatic. Eyes:  Without icterus, sclera clear and conjunctiva pink.  Ears:  Normal auditory acuity. Nose:  No deformity, discharge,  or lesions. Mouth:  No deformity or lesions, oral mucosa pink.  Lungs:  Clear to auscultation bilaterally. No wheezes, rales, or rhonchi. No distress.  Heart:  S1, S2 present without murmurs appreciated.  Abdomen:  +  BS, soft, non-tender and non-distended. No HSM noted. No guarding or rebound. No masses appreciated.  Rectal:  Deferred  Msk:  Symmetrical without gross deformities. Normal posture Extremities:  Without  edema. Neurologic:  Alert and  oriented x4;  grossly normal neurologically. Psych:  Alert and cooperative. Normal mood and affect.

## 2015-03-06 ENCOUNTER — Encounter: Payer: Self-pay | Admitting: Internal Medicine

## 2015-03-08 ENCOUNTER — Encounter (HOSPITAL_COMMUNITY): Payer: Self-pay | Admitting: Internal Medicine

## 2015-04-23 ENCOUNTER — Emergency Department (HOSPITAL_COMMUNITY): Payer: BLUE CROSS/BLUE SHIELD

## 2015-04-23 ENCOUNTER — Emergency Department (HOSPITAL_COMMUNITY)
Admission: EM | Admit: 2015-04-23 | Discharge: 2015-04-23 | Disposition: A | Discharge status: Home / Self Care | Payer: BLUE CROSS/BLUE SHIELD | Attending: Emergency Medicine | Admitting: Emergency Medicine

## 2015-04-23 ENCOUNTER — Encounter (HOSPITAL_COMMUNITY): Payer: Self-pay | Admitting: *Deleted

## 2015-04-23 DIAGNOSIS — Z79899 Other long term (current) drug therapy: Secondary | ICD-10-CM | POA: Insufficient documentation

## 2015-04-23 DIAGNOSIS — E669 Obesity, unspecified: Secondary | ICD-10-CM | POA: Diagnosis not present

## 2015-04-23 DIAGNOSIS — Z87438 Personal history of other diseases of male genital organs: Secondary | ICD-10-CM | POA: Diagnosis not present

## 2015-04-23 DIAGNOSIS — R109 Unspecified abdominal pain: Secondary | ICD-10-CM | POA: Diagnosis present

## 2015-04-23 DIAGNOSIS — Z880 Allergy status to penicillin: Secondary | ICD-10-CM | POA: Diagnosis not present

## 2015-04-23 DIAGNOSIS — N200 Calculus of kidney: Secondary | ICD-10-CM | POA: Diagnosis not present

## 2015-04-23 DIAGNOSIS — N390 Urinary tract infection, site not specified: Secondary | ICD-10-CM | POA: Insufficient documentation

## 2015-04-23 DIAGNOSIS — I10 Essential (primary) hypertension: Secondary | ICD-10-CM | POA: Diagnosis not present

## 2015-04-23 DIAGNOSIS — Z7982 Long term (current) use of aspirin: Secondary | ICD-10-CM | POA: Diagnosis not present

## 2015-04-23 LAB — URINE MICROSCOPIC-ADD ON: Squamous Epithelial / LPF: NONE SEEN

## 2015-04-23 LAB — BASIC METABOLIC PANEL
Anion gap: 9 (ref 5–15)
BUN: 16 mg/dL (ref 6–20)
CO2: 26 mmol/L (ref 22–32)
Calcium: 8.6 mg/dL — ABNORMAL LOW (ref 8.9–10.3)
Chloride: 101 mmol/L (ref 101–111)
Creatinine, Ser: 0.88 mg/dL (ref 0.61–1.24)
GFR calc Af Amer: >60 mL/min (ref >60)
GFR calc non Af Amer: >60 mL/min (ref >60)
Glucose, Bld: 163 mg/dL — ABNORMAL HIGH (ref 65–99)
Potassium: 3.3 mmol/L — ABNORMAL LOW (ref 3.5–5.1)
Sodium: 136 mmol/L (ref 135–145)

## 2015-04-23 LAB — CBC WITH DIFFERENTIAL/PLATELET
Basophils Absolute: 0.1 10*3/uL (ref 0.0–0.1)
Basophils Relative: 1 %
Eosinophils Absolute: 0.2 10*3/uL (ref 0.0–0.7)
Eosinophils Relative: 2 %
HCT: 43.7 % (ref 39.0–52.0)
Hemoglobin: 15.5 g/dL (ref 13.0–17.0)
Lymphocytes Relative: 25 %
Lymphs Abs: 2.3 10*3/uL (ref 0.7–4.0)
MCH: 31.3 pg (ref 26.0–34.0)
MCHC: 35.5 g/dL (ref 30.0–36.0)
MCV: 88.1 fL (ref 78.0–100.0)
Monocytes Absolute: 0.5 10*3/uL (ref 0.1–1.0)
Monocytes Relative: 5 %
Neutro Abs: 6.1 10*3/uL (ref 1.7–7.7)
Neutrophils Relative %: 67 %
Platelets: 157 10*3/uL (ref 150–400)
RBC: 4.96 MIL/uL (ref 4.22–5.81)
RDW: 13.4 % (ref 11.5–15.5)
WBC: 9.1 10*3/uL (ref 4.0–10.5)

## 2015-04-23 LAB — URINALYSIS, ROUTINE W REFLEX MICROSCOPIC
Bilirubin Urine: NEGATIVE
Glucose, UA: NEGATIVE mg/dL
Ketones, ur: NEGATIVE mg/dL
Nitrite: NEGATIVE
Protein, ur: 100 mg/dL — AB
Specific Gravity, Urine: >1.03 — ABNORMAL HIGH (ref 1.005–1.030)
pH: 6 (ref 5.0–8.0)

## 2015-04-23 MED ORDER — MORPHINE SULFATE (PF) 4 MG/ML IV SOLN
4.0000 mg | Freq: Once | INTRAVENOUS | Status: AC
  Administered 2015-04-23: 4 mg via INTRAVENOUS
  Filled 2015-04-23: qty 1

## 2015-04-23 MED ORDER — CIPROFLOXACIN HCL 500 MG PO TABS: 500.0000 mg | ORAL_TABLET | Freq: Two times a day (BID) | ORAL | Status: DC

## 2015-04-23 MED ORDER — OXYCODONE-ACETAMINOPHEN 5-325 MG PO TABS: 1.0000 | ORAL_TABLET | Freq: Four times a day (QID) | ORAL | Status: DC | PRN

## 2015-04-23 MED ORDER — CIPROFLOXACIN IN D5W 400 MG/200ML IV SOLN
400.0000 mg | Freq: Once | INTRAVENOUS | Status: AC
  Administered 2015-04-23: 400 mg via INTRAVENOUS
  Filled 2015-04-23: qty 200

## 2015-04-23 MED ORDER — SODIUM CHLORIDE 0.9 % IV BOLUS (SEPSIS)
1000.0000 mL | Freq: Once | INTRAVENOUS | Status: AC
  Administered 2015-04-23: 1000 mL via INTRAVENOUS

## 2015-04-23 MED ORDER — ONDANSETRON HCL 4 MG/2ML IJ SOLN
4.0000 mg | Freq: Once | INTRAMUSCULAR | Status: AC
  Administered 2015-04-23: 4 mg via INTRAVENOUS
  Filled 2015-04-23: qty 2

## 2015-04-23 NOTE — ED Provider Notes (Signed)
CSN: [id number]     Arrival date & time 04/23/15  0219 History   First MD Initiated Contact with Patient 04/23/15 0237     Chief Complaint  Patient presents with  . Flank Pain    right flank pain  since Sunday morning.       (Consider location/radiation/quality/duration/timing/severity/associated sxs/prior Treatment) HPI  This is a 59 year old male with a history of hypertension who presents with right flank and back pain. Reports that he woke up from sleep this morning with 8 out of 10 sharp right flank pain. He states that sometimes it goes into his right abdomen. He has had similar pain in the past over the last year. Also reports history of hematuria. Denies dysuria. No history of kidney stones. He did not take anything at home for his pain.  Past Medical History  Diagnosis Date  . Hypertension   . Obesity   . Erectile dysfunction   . Elevated PSA   . Impaired fasting glucose   . Seasonal rhinitis    Past Surgical History  Procedure Laterality Date  . Cardiovascular stress test    . Colonoscopy  2009    Dr. Gala Romney: tubular adenoma  . Colonoscopy N/A 03/02/2015    Procedure: COLONOSCOPY;  Surgeon: Daneil Dolin, MD;  Location: [facility];  Service: Endoscopy;  Laterality: N/A;  0830   Family History  Problem Relation Age of Onset  . Heart disease Father   . Colon cancer Neg Hx    Social History  Substance Use Topics  . Smoking status: Never Smoker   . Smokeless tobacco: None  . Alcohol Use: No    Review of Systems  Constitutional: Negative.  Negative for fever.  Respiratory: Negative.  Negative for chest tightness.   Cardiovascular: Negative.  Negative for chest pain.  Gastrointestinal: Negative.  Negative for nausea, vomiting and abdominal pain.  Genitourinary: Positive for hematuria and flank pain. Negative for dysuria.  Neurological: Negative for headaches.  All other systems reviewed and are negative.     Allergies  Penicillins  Home Medications    Prior to Admission medications   Medication Sig Start Date End Date Taking? Authorizing Provider  aspirin EC 81 MG tablet Take 81 mg by mouth daily.     Yes Historical Provider, MD  hydrochlorothiazide (HYDRODIURIL) 25 MG tablet One half tab q am 08/28/14  Yes Mikey Kirschner, MD  losartan (COZAAR) 100 MG tablet Take 1 tablet (100 mg total) by mouth daily. 08/02/14 08/02/15 Yes Mikey Kirschner, MD  ciprofloxacin (CIPRO) 500 MG tablet Take 1 tablet (500 mg total) by mouth every 12 (twelve) hours. 04/23/15   Merryl Hacker, MD  oxyCODONE-acetaminophen (PERCOCET/ROXICET) 5-325 MG tablet Take 1-2 tablets by mouth every 6 (six) hours as needed for severe pain. 04/23/15   Merryl Hacker, MD  polyethylene glycol-electrolytes (NULYTELY/GOLYTELY) 420 G solution Take 4,000 mLs by mouth once. 02/12/15   Orvil Feil, NP   BP 125/79 mmHg  Pulse 75  Temp(Src) 97.7 F (36.5 C) (Oral)  Resp 16  Ht 5\' 11"  (1.803 m)  Wt 230 lb (104.327 kg)  BMI 32.09 kg/m2  SpO2 98% Physical Exam  Constitutional: He is oriented to person, place, and time. He appears well-developed and well-nourished.  Uncomfortable appearing, no acute distress  HENT:  Head: Normocephalic and atraumatic.  Cardiovascular: Normal rate, regular rhythm and normal heart sounds.   No murmur heard. Pulmonary/Chest: Effort normal and breath sounds normal. No respiratory distress. He has  no wheezes.  Abdominal: Soft. Bowel sounds are normal. There is no tenderness. There is no rebound and no guarding.  Genitourinary:  No CVA tenderness  Musculoskeletal: He exhibits no edema.  Neurological: He is alert and oriented to person, place, and time.  Skin: Skin is warm and dry.  Psychiatric: He has a normal mood and affect.  Nursing note and vitals reviewed.   ED Course  Procedures (including critical care time) Labs Review Labs Reviewed  BASIC METABOLIC PANEL - Abnormal; Notable for the following:    Potassium 3.3 (*)    Glucose, Bld 163  (*)    Calcium 8.6 (*)    All other components within normal limits  URINALYSIS, ROUTINE W REFLEX MICROSCOPIC (NOT AT [facility]) - Abnormal; Notable for the following:    Specific Gravity, Urine >1.030 (*)    Hgb urine dipstick MODERATE (*)    Protein, ur 100 (*)    Leukocytes, UA MODERATE (*)    All other components within normal limits  URINE MICROSCOPIC-ADD ON - Abnormal; Notable for the following:    Bacteria, UA MANY (*)    All other components within normal limits  URINE CULTURE  CBC WITH DIFFERENTIAL/PLATELET    Imaging Review Ct Renal Stone Study  04/23/2015  CLINICAL DATA:  Chronic right-sided flank pain for 1 year. Pain acutely worsened over the past week. Initial encounter. EXAM: CT ABDOMEN AND PELVIS WITHOUT CONTRAST TECHNIQUE: Multidetector CT imaging of the abdomen and pelvis was performed following the standard protocol without IV contrast. COMPARISON:  None. FINDINGS: Minimal bibasilar atelectasis is noted. There is mild fatty infiltration within the liver. The liver and spleen are otherwise unremarkable. The gallbladder is within normal limits. The pancreas and adrenal glands are unremarkable. There appears to be an obstructing large 1.8 cm stone at the right renal pelvis, with underlying mild hydronephrosis particularly involving the upper pole of the right kidney. Soft tissue inflammation tracks about the proximal right ureter, concerning for underlying ureteritis. No additional obstructing stone is seen distally. The left kidney is grossly unremarkable. No nonobstructing renal stones are identified. A 1.3 cm cyst is noted at the upper pole of the left kidney. No free fluid is identified. The small bowel is unremarkable in appearance. The stomach is within normal limits. No acute vascular abnormalities are seen. The appendix is normal in caliber, without evidence of appendicitis. Scattered diverticulosis is noted along the distal descending and proximal sigmoid colon, without  evidence of diverticulitis. The bladder is mildly distended and grossly unremarkable. The prostate remains normal in size, with scattered calcification. No inguinal lymphadenopathy is seen. No acute osseous abnormalities are identified. Vacuum phenomenon is noted at L5-S1. IMPRESSION: 1. Obstructing large 1.8 cm stone at the right renal pelvis, with underlying mild hydronephrosis particularly involving the upper pole of the right kidney. Soft tissue inflammation tracks about the proximal right ureter, concerning for underlying ureteritis. 2. Small left renal cyst noted. 3. Mild fatty infiltration within the liver. 4. Scattered diverticulosis along the distal descending and proximal sigmoid colon, without evidence of diverticulitis. Electronically Signed   By: Garald Balding M.D.   On: 04/23/2015 03:46   I have personally reviewed and evaluated these images and lab results as part of my medical decision-making.   EKG Interpretation None      MDM   Final diagnoses:  Right flank pain  Kidney stone  UTI (lower urinary tract infection)   Patient presents with right flank pain. Acute onset this morning. Similar symptoms in  the past. Also reports that he has been worked up for hematuria by his primary physician. No known history of kidney stones.  Exam is largely unremarkable. No CVA tenderness. Patient was given pain medication, nausea medication, and fluids. CT renal stone study obtained and shows a 1.8 cm stone in the right renal pelvis that is obstructing.  Normal creatinine. On recheck, patient is comfortable after pain medication. Discussed with urology on call Dr. Pilar Jarvis.  Patient will require a surgery; however, if he is not ill-appearing or septic and his pain is under control, he can follow-up as an outpatient.  Urine does appear infected. Urine culture sent and patient given Rocephin. He continues to be nontoxic-appearing. No signs of sepsis. Pain is under control.  Discussed with him the  importance of close follow-up. He was given strict return precautions including fever, worsening pain or any new or worsening symptoms.  After history, exam, and medical workup I feel the patient has been appropriately medically screened and is safe for discharge home. Pertinent diagnoses were discussed with the patient. Patient was given return precautions.   Merryl Hacker, MD 04/23/15 [phone]

## 2015-04-23 NOTE — Discharge Instructions (Signed)
You were seen today and found to have a 1.8 cm kidney stone in your right kidney. You also have evidence of urinary tract infection. You are otherwise well appearing. He will likely need to have surgery on her kidney stone. You will be given pain management and antibiotics. If you develop worsening pain, fevers or any new or worsening symptoms she needs to be reevaluated immediately. Otherwise call for next available urology appointment.  Kidney Stones Kidney stones (urolithiasis) are deposits that form inside your kidneys. The intense pain is caused by the stone moving through the urinary tract. When the stone moves, the ureter goes into spasm around the stone. The stone is usually passed in the urine.  CAUSES   A disorder that makes certain neck glands produce too much parathyroid hormone (primary hyperparathyroidism).  A buildup of uric acid crystals, similar to gout in your joints.  Narrowing (stricture) of the ureter.  A kidney obstruction present at birth (congenital obstruction).  Previous surgery on the kidney or ureters.  Numerous kidney infections. SYMPTOMS   Feeling sick to your stomach (nauseous).  Throwing up (vomiting).  Blood in the urine (hematuria).  Pain that usually spreads (radiates) to the groin.  Frequency or urgency of urination. DIAGNOSIS   Taking a history and physical exam.  Blood or urine tests.  CT scan.  Occasionally, an examination of the inside of the urinary bladder (cystoscopy) is performed. TREATMENT   Observation.  Increasing your fluid intake.  Extracorporeal shock wave lithotripsy--This is a noninvasive procedure that uses shock waves to break up kidney stones.  Surgery may be needed if you have severe pain or persistent obstruction. There are various surgical procedures. Most of the procedures are performed with the use of small instruments. Only small incisions are needed to accommodate these instruments, so recovery time is  minimized. The size, location, and chemical composition are all important variables that will determine the proper choice of action for you. Talk to your health care provider to better understand your situation so that you will minimize the risk of injury to yourself and your kidney.  HOME CARE INSTRUCTIONS   Drink enough water and fluids to keep your urine clear or pale yellow. This will help you to pass the stone or stone fragments.  Strain all urine through the provided strainer. Keep all particulate matter and stones for your health care provider to see. The stone causing the pain may be as small as a grain of salt. It is very important to use the strainer each and every time you pass your urine. The collection of your stone will allow your health care provider to analyze it and verify that a stone has actually passed. The stone analysis will often identify what you can do to reduce the incidence of recurrences.  Only take over-the-counter or prescription medicines for pain, discomfort, or fever as directed by your health care provider.  Keep all follow-up visits as told by your health care provider. This is important.  Get follow-up X-rays if required. The absence of pain does not always mean that the stone has passed. It may have only stopped moving. If the urine remains completely obstructed, it can cause loss of kidney function or even complete destruction of the kidney. It is your responsibility to make sure X-rays and follow-ups are completed. Ultrasounds of the kidney can show blockages and the status of the kidney. Ultrasounds are not associated with any radiation and can be performed easily in a matter of minutes.  Make changes to your daily diet as told by your health care provider. You may be told to:  Limit the amount of salt that you eat.  Eat 5 or more servings of fruits and vegetables each day.  Limit the amount of meat, poultry, fish, and eggs that you eat.  Collect a  24-hour urine sample as told by your health care provider.You may need to collect another urine sample every 6-12 months. SEEK MEDICAL CARE IF:  You experience pain that is progressive and unresponsive to any pain medicine you have been prescribed. SEEK IMMEDIATE MEDICAL CARE IF:   Pain cannot be controlled with the prescribed medicine.  You have a fever or shaking chills.  The severity or intensity of pain increases over 18 hours and is not relieved by pain medicine.  You develop a new onset of abdominal pain.  You feel faint or pass out.  You are unable to urinate.   This information is not intended to replace advice given to you by your health care provider. Make sure you discuss any questions you have with your health care provider.   Document Released: 05/12/2005 Document Revised: 01/31/2015 Document Reviewed: 10/13/2012 Elsevier Interactive Patient Education [employer].

## 2015-04-24 LAB — URINE CULTURE

## 2015-04-25 ENCOUNTER — Ambulatory Visit (INDEPENDENT_AMBULATORY_CARE_PROVIDER_SITE_OTHER): Payer: BLUE CROSS/BLUE SHIELD | Admitting: Urology

## 2015-04-25 ENCOUNTER — Other Ambulatory Visit: Payer: Self-pay | Admitting: Urology

## 2015-04-25 DIAGNOSIS — R3129 Other microscopic hematuria: Secondary | ICD-10-CM

## 2015-04-25 DIAGNOSIS — N200 Calculus of kidney: Secondary | ICD-10-CM | POA: Diagnosis not present

## 2015-05-02 ENCOUNTER — Other Ambulatory Visit: Payer: Self-pay | Admitting: Urology

## 2015-05-07 NOTE — Patient Instructions (Addendum)
RAMCES STETZER  05/07/2015   Your procedure is scheduled on: 05-15-15  Report to [facility] Main  Entrance take [facility]  elevators to 3rd floor to  [place] at 945 AM.  Call this number if you have problems the morning of surgery [phone]   Remember: ONLY 1 PERSON MAY GO WITH YOU TO SHORT STAY TO GET  READY MORNING OF [place].  Do not eat food or drink liquids :After Midnight.     Take these medicines the morning of surgery with A SIP OF WATER: none              You may not have any metal on your body including hair pins and              piercings  Do not wear jewelry, make-up, lotions, powders or perfumes, deodorant             Do not wear nail polish.  Do not shave  48 hours prior to surgery.              Men may shave face and neck.   Do not bring valuables to the hospital. Trail.  Contacts, dentures or bridgework may not be worn into surgery.  Leave suitcase in the car. After surgery it may be brought to your room.     Patients discharged the day of surgery will not be allowed to drive home.  Name and phone number of your driver:  Special Instructions: N/A              Please read over the following fact sheets you were given: _____________________________________________________________________             [facility] - Preparing for Surgery Before surgery, you can play an important role.  Because skin is not sterile, your skin needs to be as free of germs as possible.  You can reduce the number of germs on your skin by washing with CHG (chlorahexidine gluconate) soap before surgery.  CHG is an antiseptic cleaner which kills germs and bonds with the skin to continue killing germs even after washing. Please DO NOT use if you have an allergy to CHG or antibacterial soaps.  If your skin becomes reddened/irritated stop using the CHG and inform your nurse when you arrive at Short  Stay. Do not shave (including legs and underarms) for at least 48 hours prior to the first CHG shower.  You may shave your face/neck. Please follow these instructions carefully:  1.  Shower with CHG Soap the night before surgery and the  morning of Surgery.  2.  If you choose to wash your hair, wash your hair first as usual with your  normal  shampoo.  3.  After you shampoo, rinse your hair and body thoroughly to remove the  shampoo.                           4.  Use CHG as you would any other liquid soap.  You can apply chg directly  to the skin and wash                       Gently with a scrungie or clean washcloth.  5.  Apply the CHG Soap to your body ONLY FROM THE NECK DOWN.   Do not use on face/ open                           Wound or open sores. Avoid contact with eyes, ears mouth and genitals (private parts).                       Wash face,  Genitals (private parts) with your normal soap.             6.  Wash thoroughly, paying special attention to the area where your surgery  will be performed.  7.  Thoroughly rinse your body with warm water from the neck down.  8.  DO NOT shower/wash with your normal soap after using and rinsing off  the CHG Soap.                9.  Pat yourself dry with a clean towel.            10.  Wear clean pajamas.            11.  Place clean sheets on your bed the night of your first shower and do not  sleep with pets. Day of Surgery : Do not apply any lotions/deodorants the morning of surgery.  Please wear clean clothes to the hospital/surgery center.  FAILURE TO FOLLOW THESE INSTRUCTIONS MAY RESULT IN THE CANCELLATION OF YOUR SURGERY PATIENT SIGNATURE_________________________________  NURSE SIGNATURE__________________________________  ________________________________________________________________________

## 2015-05-10 ENCOUNTER — Encounter (HOSPITAL_COMMUNITY): Payer: Self-pay

## 2015-05-10 ENCOUNTER — Encounter (HOSPITAL_COMMUNITY)
Admission: RE | Admit: 2015-05-10 | Discharge: 2015-05-10 | Disposition: A | Discharge status: Home / Self Care | Payer: BLUE CROSS/BLUE SHIELD | Source: Ambulatory Visit | Attending: Urology | Admitting: Urology

## 2015-05-10 DIAGNOSIS — Z01812 Encounter for preprocedural laboratory examination: Secondary | ICD-10-CM | POA: Diagnosis present

## 2015-05-10 DIAGNOSIS — Z01810 Encounter for preprocedural cardiovascular examination: Secondary | ICD-10-CM | POA: Insufficient documentation

## 2015-05-10 DIAGNOSIS — N200 Calculus of kidney: Secondary | ICD-10-CM | POA: Diagnosis not present

## 2015-05-10 HISTORY — DX: Other complications of anesthesia, initial encounter: T88.59XA

## 2015-05-10 HISTORY — DX: Calculus of kidney: N20.0

## 2015-05-10 HISTORY — DX: Adverse effect of unspecified anesthetic, initial encounter: T41.45XA

## 2015-05-10 NOTE — Progress Notes (Signed)
Cbc with dif, bmet 04-23-15 epic

## 2015-05-15 ENCOUNTER — Ambulatory Visit (HOSPITAL_COMMUNITY): Payer: BLUE CROSS/BLUE SHIELD | Admitting: Certified Registered Nurse Anesthetist

## 2015-05-15 ENCOUNTER — Observation Stay (HOSPITAL_COMMUNITY)
Admission: RE | Admit: 2015-05-15 | Discharge: 2015-05-16 | Disposition: A | Discharge status: Home / Self Care | Payer: BLUE CROSS/BLUE SHIELD | Source: Ambulatory Visit | Attending: Urology | Admitting: Urology

## 2015-05-15 ENCOUNTER — Encounter (HOSPITAL_COMMUNITY)
Admission: RE | Disposition: A | Discharge status: Home / Self Care | Payer: Self-pay | Source: Ambulatory Visit | Attending: Urology

## 2015-05-15 ENCOUNTER — Encounter (HOSPITAL_COMMUNITY): Payer: Self-pay | Admitting: *Deleted

## 2015-05-15 ENCOUNTER — Ambulatory Visit (HOSPITAL_COMMUNITY): Payer: BLUE CROSS/BLUE SHIELD

## 2015-05-15 DIAGNOSIS — N529 Male erectile dysfunction, unspecified: Secondary | ICD-10-CM | POA: Diagnosis not present

## 2015-05-15 DIAGNOSIS — I10 Essential (primary) hypertension: Secondary | ICD-10-CM | POA: Insufficient documentation

## 2015-05-15 DIAGNOSIS — E669 Obesity, unspecified: Secondary | ICD-10-CM | POA: Insufficient documentation

## 2015-05-15 DIAGNOSIS — N200 Calculus of kidney: Principal | ICD-10-CM

## 2015-05-15 DIAGNOSIS — Z79899 Other long term (current) drug therapy: Secondary | ICD-10-CM | POA: Insufficient documentation

## 2015-05-15 DIAGNOSIS — Z7982 Long term (current) use of aspirin: Secondary | ICD-10-CM | POA: Diagnosis not present

## 2015-05-15 HISTORY — PX: CYSTOSCOPY WITH STENT PLACEMENT: SHX5790

## 2015-05-15 HISTORY — PX: HOLMIUM LASER APPLICATION: SHX5852

## 2015-05-15 HISTORY — PX: NEPHROLITHOTOMY: SHX5134

## 2015-05-15 LAB — CBC
HCT: 43.4 % (ref 39.0–52.0)
Hemoglobin: 15.2 g/dL (ref 13.0–17.0)
MCH: 30.5 pg (ref 26.0–34.0)
MCHC: 35 g/dL (ref 30.0–36.0)
MCV: 87 fL (ref 78.0–100.0)
Platelets: 166 10*3/uL (ref 150–400)
RBC: 4.99 MIL/uL (ref 4.22–5.81)
RDW: 13.2 % (ref 11.5–15.5)
WBC: 12 10*3/uL — ABNORMAL HIGH (ref 4.0–10.5)

## 2015-05-15 LAB — BASIC METABOLIC PANEL
Anion gap: 11 (ref 5–15)
BUN: 18 mg/dL (ref 6–20)
CO2: 25 mmol/L (ref 22–32)
Calcium: 9.2 mg/dL (ref 8.9–10.3)
Chloride: 104 mmol/L (ref 101–111)
Creatinine, Ser: 0.91 mg/dL (ref 0.61–1.24)
GFR calc Af Amer: >60 mL/min (ref >60)
GFR calc non Af Amer: >60 mL/min (ref >60)
Glucose, Bld: 149 mg/dL — ABNORMAL HIGH (ref 65–99)
Potassium: 3.9 mmol/L (ref 3.5–5.1)
Sodium: 140 mmol/L (ref 135–145)

## 2015-05-15 SURGERY — NEPHROLITHOTOMY PERCUTANEOUS
Anesthesia: General | Laterality: Right

## 2015-05-15 MED ORDER — SUFENTANIL CITRATE 50 MCG/ML IV SOLN
INTRAVENOUS | Status: DC | PRN
  Administered 2015-05-15 (×2): 10 ug via INTRAVENOUS
  Administered 2015-05-15: 20 ug via INTRAVENOUS
  Administered 2015-05-15: 10 ug via INTRAVENOUS

## 2015-05-15 MED ORDER — LIDOCAINE HCL (CARDIAC) 20 MG/ML IV SOLN
INTRAVENOUS | Status: AC
  Filled 2015-05-15: qty 5

## 2015-05-15 MED ORDER — ACETAMINOPHEN 325 MG PO TABS: 650.0000 mg | ORAL_TABLET | ORAL | Status: DC | PRN

## 2015-05-15 MED ORDER — LACTATED RINGERS IV SOLN: INTRAVENOUS | Status: DC

## 2015-05-15 MED ORDER — ROCURONIUM BROMIDE 100 MG/10ML IV SOLN
INTRAVENOUS | Status: DC | PRN
  Administered 2015-05-15: 50 mg via INTRAVENOUS
  Administered 2015-05-15 (×2): 20 mg via INTRAVENOUS

## 2015-05-15 MED ORDER — ASPIRIN EC 81 MG PO TBEC
81.0000 mg | DELAYED_RELEASE_TABLET | Freq: Every day | ORAL | Status: DC
  Administered 2015-05-15: 81 mg via ORAL
  Filled 2015-05-15: qty 1

## 2015-05-15 MED ORDER — DEXAMETHASONE SODIUM PHOSPHATE 10 MG/ML IJ SOLN
INTRAMUSCULAR | Status: DC | PRN
  Administered 2015-05-15: 10 mg via INTRAVENOUS

## 2015-05-15 MED ORDER — LIDOCAINE HCL (CARDIAC) 20 MG/ML IV SOLN
INTRAVENOUS | Status: DC | PRN
  Administered 2015-05-15: 50 mg via INTRAVENOUS

## 2015-05-15 MED ORDER — IOHEXOL 300 MG/ML  SOLN
INTRAMUSCULAR | Status: DC | PRN
  Administered 2015-05-15: 42.5 mL via URETHRAL

## 2015-05-15 MED ORDER — EPHEDRINE SULFATE 50 MG/ML IJ SOLN
INTRAMUSCULAR | Status: DC | PRN
  Administered 2015-05-15 (×2): 5 mg via INTRAVENOUS

## 2015-05-15 MED ORDER — DIPHENHYDRAMINE HCL 50 MG/ML IJ SOLN: 12.5000 mg | Freq: Four times a day (QID) | INTRAMUSCULAR | Status: DC | PRN

## 2015-05-15 MED ORDER — MIDAZOLAM HCL 2 MG/2ML IJ SOLN
INTRAMUSCULAR | Status: DC | PRN
  Administered 2015-05-15: 2 mg via INTRAVENOUS

## 2015-05-15 MED ORDER — HYDROMORPHONE HCL 1 MG/ML IJ SOLN
0.5000 mg | INTRAMUSCULAR | Status: DC | PRN
  Administered 2015-05-15 (×2): 1 mg via INTRAVENOUS
  Filled 2015-05-15 (×2): qty 1

## 2015-05-15 MED ORDER — ONDANSETRON HCL 4 MG/2ML IJ SOLN
INTRAMUSCULAR | Status: AC
  Filled 2015-05-15: qty 2

## 2015-05-15 MED ORDER — SUFENTANIL CITRATE 50 MCG/ML IV SOLN
INTRAVENOUS | Status: AC
  Filled 2015-05-15: qty 1

## 2015-05-15 MED ORDER — ONDANSETRON HCL 4 MG/2ML IJ SOLN: 4.0000 mg | INTRAMUSCULAR | Status: DC | PRN

## 2015-05-15 MED ORDER — LACTATED RINGERS IV SOLN
INTRAVENOUS | Status: DC
Start: 2015-05-15 — End: 2015-05-15
  Administered 2015-05-15: 13:00:00 via INTRAVENOUS
  Administered 2015-05-15: 1000 mL via INTRAVENOUS

## 2015-05-15 MED ORDER — DIPHENHYDRAMINE HCL 12.5 MG/5ML PO ELIX: 12.5000 mg | ORAL_SOLUTION | Freq: Four times a day (QID) | ORAL | Status: DC | PRN

## 2015-05-15 MED ORDER — FENTANYL CITRATE (PF) 100 MCG/2ML IJ SOLN
INTRAMUSCULAR | Status: AC
  Filled 2015-05-15: qty 2

## 2015-05-15 MED ORDER — BELLADONNA ALKALOIDS-OPIUM 16.2-60 MG RE SUPP
1.0000 | Freq: Four times a day (QID) | RECTAL | Status: DC | PRN
  Administered 2015-05-15: 1 via RECTAL
  Filled 2015-05-15: qty 1

## 2015-05-15 MED ORDER — CEFAZOLIN SODIUM-DEXTROSE 2-3 GM-% IV SOLR
INTRAVENOUS | Status: AC
  Filled 2015-05-15: qty 50

## 2015-05-15 MED ORDER — ONDANSETRON HCL 4 MG/2ML IJ SOLN
INTRAMUSCULAR | Status: DC | PRN
  Administered 2015-05-15: 4 mg via INTRAVENOUS

## 2015-05-15 MED ORDER — FENTANYL CITRATE (PF) 100 MCG/2ML IJ SOLN
25.0000 ug | INTRAMUSCULAR | Status: DC | PRN
  Administered 2015-05-15 (×3): 50 ug via INTRAVENOUS

## 2015-05-15 MED ORDER — MIDAZOLAM HCL 2 MG/2ML IJ SOLN
INTRAMUSCULAR | Status: AC
  Filled 2015-05-15: qty 2

## 2015-05-15 MED ORDER — SODIUM CHLORIDE 0.9 % IJ SOLN
INTRAMUSCULAR | Status: AC
  Filled 2015-05-15: qty 10

## 2015-05-15 MED ORDER — SUGAMMADEX SODIUM 500 MG/5ML IV SOLN
INTRAVENOUS | Status: AC
  Filled 2015-05-15: qty 5

## 2015-05-15 MED ORDER — PROPOFOL 10 MG/ML IV BOLUS
INTRAVENOUS | Status: AC
  Filled 2015-05-15: qty 20

## 2015-05-15 MED ORDER — STERILE WATER FOR IRRIGATION IR SOLN
Status: DC | PRN
  Administered 2015-05-15: 1000 mL

## 2015-05-15 MED ORDER — CEFAZOLIN SODIUM 1-5 GM-% IV SOLN
1.0000 g | Freq: Three times a day (TID) | INTRAVENOUS | Status: AC
  Administered 2015-05-15 – 2015-05-16 (×2): 1 g via INTRAVENOUS
  Filled 2015-05-15 (×3): qty 50

## 2015-05-15 MED ORDER — SUCCINYLCHOLINE CHLORIDE 20 MG/ML IJ SOLN
INTRAMUSCULAR | Status: DC | PRN
  Administered 2015-05-15: 100 mg via INTRAVENOUS

## 2015-05-15 MED ORDER — OXYCODONE-ACETAMINOPHEN 5-325 MG PO TABS
1.0000 | ORAL_TABLET | ORAL | Status: DC | PRN
  Administered 2015-05-15 – 2015-05-16 (×3): 2 via ORAL
  Filled 2015-05-15 (×3): qty 2

## 2015-05-15 MED ORDER — SODIUM CHLORIDE 0.9 % IV SOLN
INTRAVENOUS | Status: DC
  Administered 2015-05-15 – 2015-05-16 (×2): via INTRAVENOUS

## 2015-05-15 MED ORDER — PROPOFOL 10 MG/ML IV BOLUS
INTRAVENOUS | Status: DC | PRN
  Administered 2015-05-15: 200 mg via INTRAVENOUS

## 2015-05-15 MED ORDER — EPHEDRINE SULFATE 50 MG/ML IJ SOLN
INTRAMUSCULAR | Status: AC
  Filled 2015-05-15: qty 1

## 2015-05-15 MED ORDER — CEFAZOLIN SODIUM-DEXTROSE 2-3 GM-% IV SOLR
2.0000 g | Freq: Once | INTRAVENOUS | Status: AC
  Administered 2015-05-15: 2 g via INTRAVENOUS

## 2015-05-15 MED ORDER — DEXAMETHASONE SODIUM PHOSPHATE 10 MG/ML IJ SOLN
INTRAMUSCULAR | Status: AC
  Filled 2015-05-15: qty 1

## 2015-05-15 MED ORDER — SODIUM CHLORIDE 0.9 % IR SOLN
Status: DC | PRN
  Administered 2015-05-15: 1000 mL

## 2015-05-15 MED ORDER — SUGAMMADEX SODIUM 200 MG/2ML IV SOLN
INTRAVENOUS | Status: DC | PRN
  Administered 2015-05-15: 250 mg via INTRAVENOUS

## 2015-05-15 SURGICAL SUPPLY — 64 items
BAG URINE DRAINAGE (UROLOGICAL SUPPLIES) IMPLANT
BASKET STONE NITINOL 3FRX115MB (UROLOGICAL SUPPLIES) IMPLANT
BASKET ZERO TIP NITINOL 2.4FR (BASKET) IMPLANT
BENZOIN TINCTURE PRP APPL 2/3 (GAUZE/BANDAGES/DRESSINGS) IMPLANT
BLADE SURG 15 STRL LF DISP TIS (BLADE) ×1 IMPLANT
BLADE SURG 15 STRL SS (BLADE) ×1
CATH FOLEY 2W COUNCIL 20FR 5CC (CATHETERS) IMPLANT
CATH FOLEY 2WAY SLVR  5CC 16FR (CATHETERS) ×1
CATH FOLEY 2WAY SLVR  5CC 18FR (CATHETERS)
CATH FOLEY 2WAY SLVR 5CC 16FR (CATHETERS) ×1 IMPLANT
CATH FOLEY 2WAY SLVR 5CC 18FR (CATHETERS) IMPLANT
CATH FOLEY LATEX FREE 20FR (CATHETERS) ×1
CATH FOLEY LF 20FR (CATHETERS) ×1 IMPLANT
CATH IMAGER II 65CM (CATHETERS) ×2 IMPLANT
CATH INTERMIT  6FR 70CM (CATHETERS) ×2 IMPLANT
CATH ROBINSON RED A/P 20FR (CATHETERS) IMPLANT
CATH X-FORCE N30 NEPHROSTOMY (TUBING) ×2 IMPLANT
COVER SURGICAL LIGHT HANDLE (MISCELLANEOUS) IMPLANT
DRAPE C-ARM 42X120 X-RAY (DRAPES) ×2 IMPLANT
DRAPE LINGEMAN PERC (DRAPES) ×2 IMPLANT
DRAPE SHEET LG 3/4 BI-LAMINATE (DRAPES) ×2 IMPLANT
DRAPE SURG IRRIG POUCH 19X23 (DRAPES) ×2 IMPLANT
DRSG PAD ABDOMINAL 8X10 ST (GAUZE/BANDAGES/DRESSINGS) ×4 IMPLANT
DRSG TEGADERM 8X12 (GAUZE/BANDAGES/DRESSINGS) IMPLANT
FIBER LASER FLEXIVA 1000 (UROLOGICAL SUPPLIES) IMPLANT
FIBER LASER FLEXIVA 200 (UROLOGICAL SUPPLIES) IMPLANT
FIBER LASER FLEXIVA 365 (UROLOGICAL SUPPLIES) IMPLANT
FIBER LASER FLEXIVA 550 (UROLOGICAL SUPPLIES) IMPLANT
FIBER LASER TRAC TIP (UROLOGICAL SUPPLIES) IMPLANT
GAUZE SPONGE 4X4 12PLY STRL (GAUZE/BANDAGES/DRESSINGS) IMPLANT
GLOVE BIO SURGEON STRL SZ8 (GLOVE) ×14 IMPLANT
GLOVE BIOGEL PI IND STRL 8 (GLOVE) IMPLANT
GLOVE BIOGEL PI INDICATOR 8 (GLOVE)
GOWN STRL REUS W/TWL LRG LVL3 (GOWN DISPOSABLE) ×10 IMPLANT
GOWN STRL REUS W/TWL XL LVL3 (GOWN DISPOSABLE) IMPLANT
GUIDEWIRE AMPLAZ .035X145 (WIRE) ×2 IMPLANT
GUIDEWIRE ANG ZIPWIRE 038X150 (WIRE) ×2 IMPLANT
GUIDEWIRE STR DUAL SENSOR (WIRE) ×4 IMPLANT
INSERT SILICONE FOR G14464 (MISCELLANEOUS) ×2 IMPLANT
IV SET MACRO CATH EXT 6 LUER (IV SETS) ×2 IMPLANT
KIT BASIN OR (CUSTOM PROCEDURE TRAY) ×2 IMPLANT
MANIFOLD NEPTUNE II (INSTRUMENTS) ×2 IMPLANT
NEEDLE TROCAR 18X15 ECHO (NEEDLE) ×2 IMPLANT
NEEDLE TROCAR 18X20 (NEEDLE) IMPLANT
NS IRRIG 1000ML POUR BTL (IV SOLUTION) IMPLANT
PACK CYSTO (CUSTOM PROCEDURE TRAY) ×2 IMPLANT
PROBE LITHOCLAST ULTRA 3.8X403 (UROLOGICAL SUPPLIES) ×2 IMPLANT
PROBE PNEUMATIC 1.0MMX570MM (UROLOGICAL SUPPLIES) ×2 IMPLANT
SET AMPLATZ RENAL DILATOR (MISCELLANEOUS) IMPLANT
SET IRRIG Y TYPE TUR BLADDER L (SET/KITS/TRAYS/PACK) ×4 IMPLANT
SHEATH PEELAWAY SET 9 (SHEATH) IMPLANT
SPONGE LAP 4X18 X RAY DECT (DISPOSABLE) ×2 IMPLANT
STENT CONTOUR 6FRX26X.038 (STENTS) ×2 IMPLANT
STONE CATCHER W/TUBE ADAPTER (UROLOGICAL SUPPLIES) ×2 IMPLANT
SUT SILK 2 0 30  PSL (SUTURE) ×1
SUT SILK 2 0 30 PSL (SUTURE) ×1 IMPLANT
SUT SILK 2 0 SH (SUTURE) ×6 IMPLANT
SYR 20CC LL (SYRINGE) ×4 IMPLANT
SYRINGE 10CC LL (SYRINGE) ×2 IMPLANT
TOWEL OR 17X26 10 PK STRL BLUE (TOWEL DISPOSABLE) ×2 IMPLANT
TRAY FOLEY BAG SILVER LF 16FR (CATHETERS) ×2 IMPLANT
TRAY FOLEY W/METER SILVER 16FR (SET/KITS/TRAYS/PACK) ×2 IMPLANT
TUBING CONNECTING 10 (TUBING) ×4 IMPLANT
WATER STERILE IRR 1500ML POUR (IV SOLUTION) IMPLANT

## 2015-05-15 NOTE — Anesthesia Postprocedure Evaluation (Signed)
Anesthesia Post Note  Patient: Oscar Campbell  Procedure(s) Performed: Procedure(s) (LRB): NEPHROLITHOTOMY PERCUTANEOUS WITH RIGHT NEPHROSTOMY TUBE PLACEMENT, SURGEON TO OBTAIN ACCESS (Right) HOLMIUM LASER APPLICATION (Right) CYSTOSCOPY WITH EXTERNALIZED STENT PLACEMENT (Right)  Patient location during evaluation: PACU Anesthesia Type: General Level of consciousness: awake and alert Pain management: pain level controlled Vital Signs Assessment: post-procedure vital signs reviewed and stable Respiratory status: spontaneous breathing, nonlabored ventilation, respiratory function stable and patient connected to nasal cannula oxygen Cardiovascular status: blood pressure returned to baseline and stable Postop Assessment: no signs of nausea or vomiting Anesthetic complications: no    Last Vitals:  Filed Vitals:   05/15/15 1454 05/15/15 1510  BP:  140/79  Pulse: 80 81  Temp: 36.4 C 36.6 C  Resp: 14     Last Pain:  Filed Vitals:   05/15/15 1639  PainSc: 7                  Pamla Pangle L

## 2015-05-15 NOTE — Anesthesia Preprocedure Evaluation (Addendum)
Anesthesia Evaluation  Patient identified by MRN, date of birth, ID band Patient awake    Reviewed: Allergy & Precautions, H&P , NPO status , Patient's Chart, lab work & pertinent test results  Airway Mallampati: II  TM Distance: >3 FB Neck ROM: full    Dental  (+) Dental Advisory Given, Partial Lower Front lower teeth missing:   Pulmonary neg pulmonary ROS,    Pulmonary exam normal breath sounds clear to auscultation       Cardiovascular Exercise Tolerance: Good hypertension, Pt. on medications Normal cardiovascular exam Rhythm:regular Rate:Normal     Neuro/Psych negative neurological ROS  negative psych ROS   GI/Hepatic negative GI ROS, Neg liver ROS,   Endo/Other  negative endocrine ROS  Renal/GU negative Renal ROS  negative genitourinary   Musculoskeletal   Abdominal   Peds  Hematology negative hematology ROS (+)   Anesthesia Other Findings   Reproductive/Obstetrics negative OB ROS                            Anesthesia Physical Anesthesia Plan  ASA: II  Anesthesia Plan: General   Post-op Pain Management:    Induction: Intravenous  Airway Management Planned: Oral ETT  Additional Equipment:   Intra-op Plan:   Post-operative Plan: Extubation in OR  Informed Consent: I have reviewed the patients History and Physical, chart, labs and discussed the procedure including the risks, benefits and alternatives for the proposed anesthesia with the patient or authorized representative who has indicated his/her understanding and acceptance.   Dental Advisory Given  Plan Discussed with: CRNA and Surgeon  Anesthesia Plan Comments:         Anesthesia Quick Evaluation

## 2015-05-15 NOTE — Brief Op Note (Signed)
05/15/2015  2:16 PM  PATIENT:  Oscar Campbell  59 y.o. male  PRE-OPERATIVE DIAGNOSIS:  RIGHT RENAL STONE  POST-OPERATIVE DIAGNOSIS:  RIGHT RENAL STONE  PROCEDURE:  Procedure(s): NEPHROLITHOTOMY PERCUTANEOUS WITH RIGHT NEPHROSTOMY TUBE PLACEMENT, SURGEON TO OBTAIN ACCESS (Right) HOLMIUM LASER APPLICATION (Right) CYSTOSCOPY WITH EXTERNALIZED STENT PLACEMENT (Right)  SURGEON:  Surgeon(s) and Role:    * Cleon Gustin, MD - Primary  PHYSICIAN ASSISTANT:   ASSISTANTS: none   ANESTHESIA:   general  EBL:  Total I/O In: 1750 [I.V.:1750] Out: 450 [Urine:300; Blood:150]  BLOOD ADMINISTERED:none  DRAINS: Urinary Catheter (Foley), 20 French nephrostomy, 6x26 right JJ ureteral stent  LOCAL MEDICATIONS USED:  NONE  SPECIMEN:  Source of Specimen:  stone  DISPOSITION OF SPECIMEN:  N/A  COUNTS:  YES  TOURNIQUET:  * No tourniquets in log *  DICTATION: .Note written in EPIC  PLAN OF CARE: Admit for overnight observation  PATIENT DISPOSITION:  PACU - hemodynamically stable.   Delay start of Pharmacological VTE agent (>24hrs) due to surgical blood loss or risk of bleeding: no

## 2015-05-15 NOTE — Transfer of Care (Signed)
Immediate Anesthesia Transfer of Care Note  Patient: SEM SORRELLS  Procedure(s) Performed: Procedure(s): NEPHROLITHOTOMY PERCUTANEOUS WITH RIGHT NEPHROSTOMY TUBE PLACEMENT, SURGEON TO OBTAIN ACCESS (Right) HOLMIUM LASER APPLICATION (Right) CYSTOSCOPY WITH EXTERNALIZED STENT PLACEMENT (Right)  Patient Location: PACU  Anesthesia Type:General  Level of Consciousness: awake and oriented  Airway & Oxygen Therapy: Patient Spontanous Breathing and Patient connected to face mask oxygen  Post-op Assessment: Report given to RN and Post -op Vital signs reviewed and stable  Post vital signs: Reviewed and stable  Last Vitals:  Filed Vitals:   05/15/15 0932  BP: 150/94  Pulse: 79  Temp: 36.6 C  Resp: 18    Complications: No apparent anesthesia complications

## 2015-05-15 NOTE — Anesthesia Procedure Notes (Signed)
Procedure Name: Intubation Performed by: Gean Maidens Pre-anesthesia Checklist: Patient identified, Emergency Drugs available, Suction available, Patient being monitored and Timeout performed Patient Re-evaluated:Patient Re-evaluated prior to inductionOxygen Delivery Method: Circle system utilized Preoxygenation: Pre-oxygenation with 100% oxygen Intubation Type: IV induction Ventilation: Mask ventilation without difficulty Laryngoscope Size: Mac and 4 Grade View: Grade III Tube type: Oral Tube size: 8.0 mm Number of attempts: 1 Airway Equipment and Method: Stylet Placement Confirmation: positive ETCO2,  CO2 detector and breath sounds checked- equal and bilateral Secured at: 23 cm Tube secured with: Tape Dental Injury: Teeth and Oropharynx as per pre-operative assessment  Future Recommendations: Recommend- induction with short-acting agent, and alternative techniques readily available

## 2015-05-15 NOTE — H&P (Signed)
Urology Admission H&P  Chief Complaint: right flank pain  History of Present Illness: Mr Dearborn is a 59yo with a hx of right flank pain who was diagnosed with a 1.8cm right renal pelvic calculus. This is his first stone event. He has had gross hematuria.  Past Medical History  Diagnosis Date  . Hypertension   . Obesity   . Erectile dysfunction   . Elevated PSA   . Impaired fasting glucose   . Seasonal rhinitis   . Kidney stones   . Complication of anesthesia     woke up during 1st colonscopy   Past Surgical History  Procedure Laterality Date  . Cardiovascular stress test    . Colonoscopy  2009    Dr. Gala Romney: tubular adenoma  . Colonoscopy N/A 03/02/2015    Procedure: COLONOSCOPY;  Surgeon: Daneil Dolin, MD;  Location: [facility];  Service: Endoscopy;  Laterality: N/A;  0830    Home Medications:  Prescriptions prior to admission  Medication Sig Dispense Refill Last Dose  . hydrochlorothiazide (HYDRODIURIL) 25 MG tablet One half tab q am (Patient taking differently: Take 25 mg by mouth daily. ) 45 tablet 3 05/14/2015 at am  . losartan (COZAAR) 100 MG tablet Take 1 tablet (100 mg total) by mouth daily. (Patient taking differently: Take 100 mg by mouth at bedtime. ) 90 tablet 1 05/14/2015 at pm  . oxyCODONE-acetaminophen (PERCOCET/ROXICET) 5-325 MG tablet Take 1-2 tablets by mouth every 6 (six) hours as needed for severe pain. 20 tablet 0 05/14/2015 at pm  . aspirin EC 81 MG tablet Take 81 mg by mouth daily.     More than a month at Unknown time   Allergies:  Allergies  Allergen Reactions  . Penicillins Rash    Has patient had a PCN reaction causing immediate rash, facial/tongue/throat swelling, SOB or lightheadedness with hypotension: unkown Has patient had a PCN reaction causing severe rash involving mucus membranes or skin necrosis: no Has patient had a PCN reaction that required hospitalization no Has patient had a PCN reaction occurring within the last 10 years: no If  all of the above answers are "NO", then may proceed with Cephalosporin use.    Family History  Problem Relation Age of Onset  . Heart disease Father   . Colon cancer Neg Hx    Social History:  reports that he has never smoked. He has never used smokeless tobacco. He reports that he does not drink alcohol or use illicit drugs.  Review of Systems  Gastrointestinal: Positive for nausea.  Genitourinary: Positive for hematuria and flank pain.  All other systems reviewed and are negative.   Physical Exam:  Vital signs in last 24 hours: Temp:  [97.8 F (36.6 C)] 97.8 F (36.6 C) (12/20 0932) Pulse Rate:  [79] 79 (12/20 0932) Resp:  [18] 18 (12/20 0932) BP: (150)/(94) 150/94 mmHg (12/20 0932) SpO2:  [99 %] 99 % (12/20 0932) Weight:  [104.327 kg (230 lb)] 104.327 kg (230 lb) (12/20 0932) Physical Exam  Constitutional: He is oriented to person, place, and time. He appears well-developed and well-nourished.  HENT:  Head: Normocephalic and atraumatic.  Eyes: EOM are normal. Pupils are equal, round, and reactive to light.  Neck: Normal range of motion. No thyromegaly present.  Cardiovascular: Normal rate and regular rhythm.   Respiratory: Effort normal. No respiratory distress.  GI: Soft. He exhibits no distension.  Musculoskeletal: Normal range of motion.  Neurological: He is alert and oriented to person, place, and time.  Skin: Skin is warm and dry.  Psychiatric: He has a normal mood and affect. His behavior is normal. Judgment and thought content normal.    Laboratory Data:  No results found for this or any previous visit (from the past 24 hour(s)). No results found for this or any previous visit (from the past 240 hour(s)). Creatinine: No results for input(s): CREATININE in the last 168 hours. Baseline Creatinine: unknown  Impression/Assessment:  59yo with right renal calculus  Plan:  The risks/benefits/alternatives to R PCNL was explained to the patient and he understands  and wishes to proceed with surgery  Shelli Portilla L 05/15/2015, 11:51 AM

## 2015-05-16 ENCOUNTER — Encounter (HOSPITAL_COMMUNITY): Payer: Self-pay | Admitting: Urology

## 2015-05-16 DIAGNOSIS — N200 Calculus of kidney: Secondary | ICD-10-CM | POA: Diagnosis not present

## 2015-05-16 LAB — BASIC METABOLIC PANEL
Anion gap: 10 (ref 5–15)
BUN: 18 mg/dL (ref 6–20)
CO2: 25 mmol/L (ref 22–32)
Calcium: 8.8 mg/dL — ABNORMAL LOW (ref 8.9–10.3)
Chloride: 102 mmol/L (ref 101–111)
Creatinine, Ser: 1.01 mg/dL (ref 0.61–1.24)
GFR calc Af Amer: >60 mL/min (ref >60)
GFR calc non Af Amer: >60 mL/min (ref >60)
Glucose, Bld: 145 mg/dL — ABNORMAL HIGH (ref 65–99)
Potassium: 3.8 mmol/L (ref 3.5–5.1)
Sodium: 137 mmol/L (ref 135–145)

## 2015-05-16 LAB — CBC
HCT: 39.5 % (ref 39.0–52.0)
Hemoglobin: 13.8 g/dL (ref 13.0–17.0)
MCH: 30.5 pg (ref 26.0–34.0)
MCHC: 34.9 g/dL (ref 30.0–36.0)
MCV: 87.2 fL (ref 78.0–100.0)
Platelets: 193 10*3/uL (ref 150–400)
RBC: 4.53 MIL/uL (ref 4.22–5.81)
RDW: 13.3 % (ref 11.5–15.5)
WBC: 19.9 10*3/uL — ABNORMAL HIGH (ref 4.0–10.5)

## 2015-05-16 MED ORDER — TAMSULOSIN HCL 0.4 MG PO CAPS: 0.4000 mg | ORAL_CAPSULE | Freq: Every day | ORAL | Status: DC

## 2015-05-16 MED ORDER — OXYCODONE-ACETAMINOPHEN 5-325 MG PO TABS: 1.0000 | ORAL_TABLET | Freq: Four times a day (QID) | ORAL | Status: DC | PRN

## 2015-05-16 MED ORDER — PHENAZOPYRIDINE HCL 100 MG PO TABS
100.0000 mg | ORAL_TABLET | Freq: Three times a day (TID) | ORAL | Status: DC | PRN
Start: 2015-05-16 — End: 2015-08-09

## 2015-05-16 NOTE — Progress Notes (Signed)
Educated patient on leg bag/standard drainage bag for his nephrostomy tube. Pt. Stated he preferred to just use the standard drainage bag instead of leg bag but felt comfortable being discharged with it.  F/u appointment in place on Friday.  Prescriptions given to patient.  Patient ready for d/c.

## 2015-05-17 NOTE — Op Note (Signed)
Preoperative diagnosis: Right renal stone  Postoperative diagnosis: Same  Procedure 1.  Right percutaneous nephrostolithotomy for stone greater than 2 cm 2.  Right nephrostogram 3.  Intraoperative fluoroscopy, under 1 hour, with interpretation 4.  Placement of a 6 x 26 double-J ureteral stent. 5.  Percutaneous access into the Right renal collecting system 6.  Placement of a 70 French nephrostomy tube 7.  Dilation of percutaneous tract  Attending: Dr. Alyson Ingles  Anesthesia: General  Estimated blood loss: 150  Antibiotics: Ancef  Drains: 1.  16 French Foley catheter 2.  6 x 26 Right double-J ureteral stent 3.  18 French nephrostomy tube  Specimens: Stone for analysis  Findings: no masses/lesions int he bladder. 2cm renal pelvis calculus. Lower pole access  Indications: Patient is a 59 year old male with a history of a large right renal stone.  After discussing treatment options and they decided to proceed with right percutaneous nephrostolithotomy.    Procedure in detail: Prior to procedure consent was obtained.  Patient was brought to the operating room and a timeout was done to ensure correct patient, correct procedure, and correct site.  General anesthesia was administered. The patients genetalia was prepped and draped. A flexible cystoscope was passed into the urethra and then the bladder. No masses or lesions were seen in the bladder. Ureteral orifices were in the normal anatomic location. A sensor wire was passed into the right ureter and up to the renal pelvis. A 6 french ureteral catheter was advanced over the wire and up to the renal pelvis the wire was then removed and so was the cystoscope.  A 16 French Foley catheter was in place and the ureteral catheter was secured with a 0 silk tie.  The patient was then placed in the prone position.   The right flank was then prepped and draped in usual sterile fashion.  Contrast was instilled throught the ureteral catheter and the bullseye  technique was used to gain access into the lower pole. Once we gained access a sensor wire was advanced through the spinal needle and down the ureter to the bladder. We then used an 8 french and 10 french dilater to dilate the tract. We then used a sheath to place a second wire down to the bladder.  We then made an incision at the level of the skin and over the wire we then placed a NephroMax dilator.  We dilated the nephrostomy tract to 30 [place] and held this 18 cm of water for 1 minute.  We then  placed the access sheath over the balloon.  The balloon was then deflated.  We then used a rigid nephroscope to perform nephroscopy.  We encountered a large lower pole stone. Using the LithoClast the stone was fragmented and the fragments were removed with graspers.  The stone fragments were sent for composition analysis.  We then removed the nephroscope and over the wire placed a 6 x 26 double-J ureteral stent.  The wire was then removed and good coil was noted in the renal pelvis under direct vision in the bladder under fluoroscopy.  We then placed a 18 French nephrostomy tube through the sheath into the renal pelvis.  The balloon was inflated with 3 mls of contrast.  We then removed the access sheath in and obtained another nephrostogram.  We noted minimal extravasation of contrast.  We then secured the nephrostomy tube with 0 silks in interrupted fashion.  Dressing was placed over the nephrostomy tube site and this then concluded the  procedure was well-tolerated by the patient.  Complications: None  Condition: Stable, extubated, transferred to PACU  Plan: Patient is to be admitted overnight for observation.  Foley catheter through the morning.  Pt is then to be discharged home and followup in 1 week for nephrostomy tube removal. Stent to be removed 1 week after nephrostomy tube

## 2015-05-17 NOTE — Discharge Summary (Signed)
Physician Discharge Summary  Patient ID: Oscar Campbell MRN: [id number] DOB/AGE: 1956-01-21 59 y.o.  Admit date: 05/15/2015 Discharge date: 05/16/2015  Admission Diagnoses: Nephrolithiasis  Discharge Diagnoses:  Active Problems:   Nephrolithiasis   Discharged Condition: good  Hospital Course: The patient tolerated the procedure well and was transferred to the floor on IV pain meds, IV fluid. On POD#1 foley was removed, pt was started on regular diet and they ambulated in the halls. Prior to discharge the pt was tolerating a regular diet, pain was controlled on PO pain meds, they were ambulating without difficulty, and they had normal bowel function.   Consults: None  Significant Diagnostic Studies: none  Treatments: surgery: R PCNL  Discharge Exam: Blood pressure 116/69, pulse 75, temperature 98.4 F (36.9 C), temperature source Oral, resp. rate 16, height 5\' 11"  (1.803 m), weight 104.327 kg (230 lb), SpO2 97 %. General appearance: alert, cooperative and appears stated age Head: Normocephalic, without obvious abnormality, atraumatic Eyes: conjunctivae/corneas clear. PERRL, EOM's intact. Fundi benign. Neck: no adenopathy, no carotid bruit, no JVD, supple, symmetrical, trachea midline and thyroid not enlarged, symmetric, no tenderness/mass/nodules Chest wall: no tenderness GI: soft, non-tender; bowel sounds normal; no masses,  no organomegaly Male genitalia: normal Extremities: extremities normal, atraumatic, no cyanosis or edema Neurologic: Alert and oriented X 3, normal strength and tone. Normal symmetric reflexes. Normal coordination and gait  Disposition: 01-Home or Self Care     Medication List    TAKE these medications        aspirin EC 81 MG tablet  Take 81 mg by mouth daily.     hydrochlorothiazide 25 MG tablet  Commonly known as:  HYDRODIURIL  One half tab q am     losartan 100 MG tablet  Commonly known as:  COZAAR  Take 1 tablet (100 mg total) by mouth  daily.     oxyCODONE-acetaminophen 5-325 MG tablet  Commonly known as:  PERCOCET/ROXICET  Take 1-2 tablets by mouth every 6 (six) hours as needed for severe pain.     phenazopyridine 100 MG tablet  Commonly known as:  PYRIDIUM  Take 1 tablet (100 mg total) by mouth 3 (three) times daily as needed for pain.     tamsulosin 0.4 MG Caps capsule  Commonly known as:  FLOMAX  Take 1 capsule (0.4 mg total) by mouth daily after supper.         SignedCleon Gustin 05/17/2015, 6:33 PM

## 2015-05-23 ENCOUNTER — Ambulatory Visit (INDEPENDENT_AMBULATORY_CARE_PROVIDER_SITE_OTHER): Payer: Self-pay | Admitting: Urology

## 2015-05-23 DIAGNOSIS — N200 Calculus of kidney: Secondary | ICD-10-CM

## 2015-05-30 ENCOUNTER — Ambulatory Visit (INDEPENDENT_AMBULATORY_CARE_PROVIDER_SITE_OTHER): Payer: BLUE CROSS/BLUE SHIELD | Admitting: Urology

## 2015-05-30 ENCOUNTER — Other Ambulatory Visit: Payer: Self-pay | Admitting: Urology

## 2015-05-30 DIAGNOSIS — N200 Calculus of kidney: Secondary | ICD-10-CM

## 2015-06-06 ENCOUNTER — Other Ambulatory Visit: Payer: Self-pay | Admitting: Family Medicine

## 2015-07-02 ENCOUNTER — Ambulatory Visit (HOSPITAL_COMMUNITY)
Admission: RE | Admit: 2015-07-02 | Discharge: 2015-07-02 | Disposition: A | Discharge status: Home / Self Care | Payer: BLUE CROSS/BLUE SHIELD | Source: Ambulatory Visit | Attending: Urology | Admitting: Urology

## 2015-07-02 DIAGNOSIS — N200 Calculus of kidney: Secondary | ICD-10-CM | POA: Diagnosis present

## 2015-07-11 ENCOUNTER — Ambulatory Visit (INDEPENDENT_AMBULATORY_CARE_PROVIDER_SITE_OTHER): Payer: Self-pay | Admitting: Urology

## 2015-07-11 ENCOUNTER — Other Ambulatory Visit: Payer: Self-pay | Admitting: Urology

## 2015-07-11 DIAGNOSIS — N200 Calculus of kidney: Secondary | ICD-10-CM

## 2015-07-24 ENCOUNTER — Ambulatory Visit (INDEPENDENT_AMBULATORY_CARE_PROVIDER_SITE_OTHER): Payer: BLUE CROSS/BLUE SHIELD | Admitting: Family Medicine

## 2015-07-24 ENCOUNTER — Encounter: Payer: Self-pay | Admitting: Family Medicine

## 2015-07-24 VITALS — BP 120/74 | Ht 71.0 in | Wt 235.4 lb

## 2015-07-24 DIAGNOSIS — I10 Essential (primary) hypertension: Secondary | ICD-10-CM

## 2015-07-24 MED ORDER — HYDROCHLOROTHIAZIDE 25 MG PO TABS: ORAL_TABLET | ORAL | Status: DC

## 2015-07-24 MED ORDER — LOSARTAN POTASSIUM 100 MG PO TABS: 100.0000 mg | ORAL_TABLET | Freq: Every day | ORAL | Status: DC

## 2015-07-24 NOTE — Progress Notes (Signed)
   Subjective:    Patient ID: Oscar Campbell, male    DOB: 1956/04/10, 60 y.o.   MRN: [id number]  Hypertension This is a chronic problem. The current episode started more than 1 year ago. There are no compliance problems.    htn overall numbers good Patient states no other concerns this visit. w  Walking regulaly  Does n miss bp meds   Exercise and a fair amount. Watching salt intake. Blood pressures generally good when checked elsewhere  Review of Systems No headache no chest pain and back pain no vomiting    Objective:   Physical Exam  Alert vital stable HEENT normal. Lungs clear. Heart regular in rhythm.      Assessment & Plan:  Impression 1 hypertension good control plan diet exercise discussed. Medications refilled. Recheck in 6 months wellness plus blood pressure interventions WSL

## 2015-08-09 ENCOUNTER — Encounter: Payer: Self-pay | Admitting: Family Medicine

## 2015-08-09 ENCOUNTER — Ambulatory Visit (INDEPENDENT_AMBULATORY_CARE_PROVIDER_SITE_OTHER): Payer: BLUE CROSS/BLUE SHIELD | Admitting: Family Medicine

## 2015-08-09 VITALS — BP 138/84 | Temp 98.1°F | Ht 71.0 in | Wt 243.0 lb

## 2015-08-09 DIAGNOSIS — M778 Other enthesopathies, not elsewhere classified: Secondary | ICD-10-CM | POA: Diagnosis not present

## 2015-08-09 NOTE — Progress Notes (Signed)
   Subjective:    Patient ID: Oscar Campbell, male    DOB: [birth year], 60 y.o.   MRN: [id number]  Hand Pain  There was no injury mechanism. Pain location: left fingers to left elbow. Quality: sharp. Associated symptoms include numbness. Associated symptoms comments: swelling. He has tried NSAIDs (hand/wrist brace) for the symptoms. The treatment provided moderate relief.  started 4 days ago.    Stew for church fri night  Went ot bed off and on napping  Developed sharp pain in the nmid of the night  Tried ice pack  Two d later sig swollen  ibufron four pills four more later     Physically pushed up and down the stew, did it for an hour    Review of Systems  Neurological: Positive for numbness.       Objective:   Physical Exam  Alert vital stable lungs clear heart rare rhythm H&T normal left wrist mild swelling ventral surface positive tenderness some pain with rotation grip intact      Assessment & Plan:  Impression status post overuse tendinitis with also flare of carpal tunnel syndrome plan use brace. Anti-inflammatory medicine recommended expect gradual resolution WSL

## 2015-09-10 ENCOUNTER — Other Ambulatory Visit: Payer: Self-pay | Admitting: Urology

## 2015-09-10 ENCOUNTER — Ambulatory Visit (HOSPITAL_COMMUNITY)
Admission: RE | Admit: 2015-09-10 | Discharge: 2015-09-10 | Disposition: A | Discharge status: Home / Self Care | Payer: BLUE CROSS/BLUE SHIELD | Source: Ambulatory Visit | Attending: Urology | Admitting: Urology

## 2015-09-10 DIAGNOSIS — N200 Calculus of kidney: Secondary | ICD-10-CM | POA: Diagnosis not present

## 2015-09-12 ENCOUNTER — Ambulatory Visit: Payer: BLUE CROSS/BLUE SHIELD | Admitting: Urology

## 2015-09-19 ENCOUNTER — Ambulatory Visit (INDEPENDENT_AMBULATORY_CARE_PROVIDER_SITE_OTHER): Payer: BLUE CROSS/BLUE SHIELD | Admitting: Urology

## 2015-09-19 DIAGNOSIS — R3129 Other microscopic hematuria: Secondary | ICD-10-CM

## 2015-09-19 DIAGNOSIS — N200 Calculus of kidney: Secondary | ICD-10-CM

## 2016-01-21 ENCOUNTER — Encounter: Payer: Self-pay | Admitting: Family Medicine

## 2016-01-21 ENCOUNTER — Ambulatory Visit (INDEPENDENT_AMBULATORY_CARE_PROVIDER_SITE_OTHER): Payer: BLUE CROSS/BLUE SHIELD | Admitting: Family Medicine

## 2016-01-21 VITALS — BP 122/80 | Ht 69.25 in | Wt 241.0 lb

## 2016-01-21 DIAGNOSIS — Z125 Encounter for screening for malignant neoplasm of prostate: Secondary | ICD-10-CM | POA: Diagnosis not present

## 2016-01-21 DIAGNOSIS — I10 Essential (primary) hypertension: Secondary | ICD-10-CM | POA: Diagnosis not present

## 2016-01-21 DIAGNOSIS — Z13220 Encounter for screening for lipoid disorders: Secondary | ICD-10-CM

## 2016-01-21 DIAGNOSIS — R7301 Impaired fasting glucose: Secondary | ICD-10-CM

## 2016-01-21 DIAGNOSIS — Z79899 Other long term (current) drug therapy: Secondary | ICD-10-CM | POA: Diagnosis not present

## 2016-01-21 DIAGNOSIS — R739 Hyperglycemia, unspecified: Secondary | ICD-10-CM | POA: Diagnosis not present

## 2016-01-21 DIAGNOSIS — Z0000 Encounter for general adult medical examination without abnormal findings: Secondary | ICD-10-CM | POA: Diagnosis not present

## 2016-01-21 MED ORDER — LOSARTAN POTASSIUM 100 MG PO TABS: 100.0000 mg | ORAL_TABLET | Freq: Every day | ORAL | 1 refills | Status: DC

## 2016-01-21 MED ORDER — HYDROCHLOROTHIAZIDE 25 MG PO TABS: ORAL_TABLET | ORAL | 1 refills | Status: DC

## 2016-01-21 NOTE — Progress Notes (Signed)
   Subjective:    Patient ID: Oscar Campbell, male    DOB: Jan 13, 1956, 60 y.o.   MRN: [id number]  HPI The patient comes in today for a wellness v A review of their health history was completed.  A review of medications was also completed.  Any needed refills; yes update all meds   Eating habits: health conscious  Falls/  MVA accidents in past few months: none  Regular exercise: walks  Specialist pt sees on regular basis: urology. For kidney stone. Dr. Caprice Beaver. Goes back in October for kidney stone.   Preventative health issues were discussed.   Additional concerns: none Blood pressure medicine and blood pressure levels reviewed today with patient. Compliant with blood pressure medicine. States does not miss a dose. No obvious side effects. Blood pressure generally good when checked elsewhere. Watching salt intake.  Patient has history of elevated sugar. Positive family history diabetes. Admits not to have been the best diet.   Work busy   Walking once per wk or with walking  Back to uro in [place] may need more intervention on stone   BP 140 over 78 last wk,  gma has diabetes     Review of Systems  Constitutional: Negative for activity change, appetite change and fever.  HENT: Negative for congestion and rhinorrhea.   Eyes: Negative for discharge.  Respiratory: Negative for cough and wheezing.   Cardiovascular: Negative for chest pain.  Gastrointestinal: Negative for abdominal pain, blood in stool and vomiting.  Genitourinary: Negative for difficulty urinating and frequency.  Musculoskeletal: Negative for neck pain.  Skin: Negative for rash.  Allergic/Immunologic: Negative for environmental allergies and food allergies.  Neurological: Negative for weakness and headaches.  Psychiatric/Behavioral: Negative for agitation.  All other systems reviewed and are negative.      Objective:   Physical Exam  Constitutional: He appears well-developed and well-nourished.    HENT:  Head: Normocephalic and atraumatic.  Right Ear: External ear normal.  Left Ear: External ear normal.  Nose: Nose normal.  Mouth/Throat: Oropharynx is clear and moist.  Eyes: EOM are normal. Pupils are equal, round, and reactive to light.  Neck: Normal range of motion. Neck supple. No thyromegaly present.  Cardiovascular: Normal rate, regular rhythm and normal heart sounds.   No murmur heard. Pulmonary/Chest: Effort normal and breath sounds normal. No respiratory distress. He has no wheezes.  Abdominal: Soft. Bowel sounds are normal. He exhibits no distension and no mass. There is no tenderness.  Genitourinary: Penis normal.  Musculoskeletal: Normal range of motion. He exhibits no edema.  Lymphadenopathy:    He has no cervical adenopathy.  Neurological: He is alert. He exhibits normal muscle tone.  Skin: Skin is warm and dry. No erythema.  Psychiatric: He has a normal mood and affect. His behavior is normal. Judgment normal.    Prostate within normal limits      Assessment & Plan:  Impression well adult exam. Diet exercise discussed. Anticipatory guidance given. Up-to-date on colonoscopy. #2 hypertension good control discussed maintain same meds #3 impaired fasting glucose status uncertain plan appropriate blood work. Diet exercise discussed. Medications refilled. Needs to try to lose weight. Recheck in 6 months. WSL

## 2016-01-26 LAB — PSA: Prostate Specific Ag, Serum: 1.1 ng/mL (ref 0.0–4.0)

## 2016-01-26 LAB — HEPATIC FUNCTION PANEL
ALT: 36 IU/L (ref 0–44)
AST: 30 IU/L (ref 0–40)
Albumin: 4.3 g/dL (ref 3.5–5.5)
Alkaline Phosphatase: 71 IU/L (ref 39–117)
Bilirubin Total: 0.7 mg/dL (ref 0.0–1.2)
Bilirubin, Direct: 0.15 mg/dL (ref 0.00–0.40)
Total Protein: 7.2 g/dL (ref 6.0–8.5)

## 2016-01-26 LAB — BASIC METABOLIC PANEL
BUN/Creatinine Ratio: 15 (ref 9–20)
BUN: 13 mg/dL (ref 6–24)
CO2: 25 mmol/L (ref 18–29)
Calcium: 9.3 mg/dL (ref 8.7–10.2)
Chloride: 101 mmol/L (ref 96–106)
Creatinine, Ser: 0.89 mg/dL (ref 0.76–1.27)
GFR calc Af Amer: 108 mL/min/{1.73_m2} (ref >59)
GFR calc non Af Amer: 94 mL/min/{1.73_m2} (ref >59)
Glucose: 109 mg/dL — ABNORMAL HIGH (ref 65–99)
Potassium: 4 mmol/L (ref 3.5–5.2)
Sodium: 141 mmol/L (ref 134–144)

## 2016-01-26 LAB — LIPID PANEL
Chol/HDL Ratio: 4.8 ratio units (ref 0.0–5.0)
Cholesterol, Total: 163 mg/dL (ref 100–199)
HDL: 34 mg/dL — ABNORMAL LOW (ref >39)
LDL Calculated: 94 mg/dL (ref 0–99)
Triglycerides: 175 mg/dL — ABNORMAL HIGH (ref 0–149)
VLDL Cholesterol Cal: 35 mg/dL (ref 5–40)

## 2016-01-26 LAB — HEMOGLOBIN A1C
Est. average glucose Bld gHb Est-mCnc: 105 mg/dL
Hgb A1c MFr Bld: 5.3 % (ref 4.8–5.6)

## 2016-01-27 ENCOUNTER — Encounter: Payer: Self-pay | Admitting: Family Medicine

## 2016-02-22 ENCOUNTER — Other Ambulatory Visit: Payer: Self-pay | Admitting: Urology

## 2016-02-22 DIAGNOSIS — N29 Other disorders of kidney and ureter in diseases classified elsewhere: Principal | ICD-10-CM

## 2016-02-22 DIAGNOSIS — N200 Calculus of kidney: Secondary | ICD-10-CM

## 2016-03-17 ENCOUNTER — Ambulatory Visit (HOSPITAL_COMMUNITY)
Admission: RE | Admit: 2016-03-17 | Discharge: 2016-03-17 | Disposition: A | Discharge status: Home / Self Care | Payer: BLUE CROSS/BLUE SHIELD | Source: Ambulatory Visit | Attending: Urology | Admitting: Urology

## 2016-03-17 DIAGNOSIS — N200 Calculus of kidney: Secondary | ICD-10-CM | POA: Diagnosis not present

## 2016-03-17 DIAGNOSIS — N281 Cyst of kidney, acquired: Secondary | ICD-10-CM | POA: Diagnosis not present

## 2016-03-19 ENCOUNTER — Ambulatory Visit (INDEPENDENT_AMBULATORY_CARE_PROVIDER_SITE_OTHER): Payer: BLUE CROSS/BLUE SHIELD | Admitting: Urology

## 2016-03-19 DIAGNOSIS — N200 Calculus of kidney: Secondary | ICD-10-CM

## 2016-04-21 ENCOUNTER — Ambulatory Visit (INDEPENDENT_AMBULATORY_CARE_PROVIDER_SITE_OTHER): Payer: BLUE CROSS/BLUE SHIELD | Admitting: Family Medicine

## 2016-04-21 ENCOUNTER — Encounter: Payer: Self-pay | Admitting: Family Medicine

## 2016-04-21 VITALS — BP 124/78 | Temp 98.2°F | Ht 69.25 in | Wt 252.6 lb

## 2016-04-21 DIAGNOSIS — J019 Acute sinusitis, unspecified: Secondary | ICD-10-CM

## 2016-04-21 DIAGNOSIS — J209 Acute bronchitis, unspecified: Secondary | ICD-10-CM

## 2016-04-21 DIAGNOSIS — B348 Other viral infections of unspecified site: Secondary | ICD-10-CM

## 2016-04-21 DIAGNOSIS — B338 Other specified viral diseases: Secondary | ICD-10-CM

## 2016-04-21 MED ORDER — AZITHROMYCIN 250 MG PO TABS: ORAL_TABLET | ORAL | 0 refills | Status: DC

## 2016-04-21 NOTE — Progress Notes (Signed)
   Subjective:    Patient ID: GE SLASKI, male    DOB: 28-Dec-1955, 60 y.o.   MRN: [id number]  Sinus Problem  This is a new problem. The current episode started in the past 7 days. Associated symptoms include chills, congestion, coughing, headaches and a sore throat. Treatments tried: mucinex.  Viral like illness for a few days sore throat runny nose body aches now with sinus pressure pain discomfort denies high fever chills sweats relates the mucus is clearish.    Review of Systems  Constitutional: Positive for chills and fatigue. Negative for fever.  HENT: Positive for congestion and sore throat.   Respiratory: Positive for cough.   Musculoskeletal: Positive for myalgias.  Neurological: Positive for headaches.       Objective:   Physical Exam  Constitutional: He appears well-developed.  HENT:  Head: Normocephalic.  Mouth/Throat: Oropharynx is clear and moist. No oropharyngeal exudate.  Neck: Normal range of motion.  Cardiovascular: Normal rate, regular rhythm and normal heart sounds.   No murmur heard. Pulmonary/Chest: Effort normal and breath sounds normal. He has no wheezes.  Lymphadenopathy:    He has no cervical adenopathy.  Neurological: He exhibits normal muscle tone.  Skin: Skin is warm and dry.  Nursing note and vitals reviewed.  There is some chest congestion noted on the right side in the bronchial region. No rails or rhonchi.       Assessment & Plan:  Viral syndrome Possible early secondary sinusitis Bronchial involvement on the right side although I do not feel it's pneumonia I would recommend antibiotics to cover for the possibility of bacterial component Follow-up if progressive troubles or worse warning signs were discussed with the patient

## 2016-07-23 ENCOUNTER — Ambulatory Visit (INDEPENDENT_AMBULATORY_CARE_PROVIDER_SITE_OTHER): Payer: BLUE CROSS/BLUE SHIELD | Admitting: Family Medicine

## 2016-07-23 ENCOUNTER — Encounter: Payer: Self-pay | Admitting: Family Medicine

## 2016-07-23 VITALS — BP 130/78 | Ht 69.25 in | Wt 251.6 lb

## 2016-07-23 DIAGNOSIS — I10 Essential (primary) hypertension: Secondary | ICD-10-CM | POA: Diagnosis not present

## 2016-07-23 MED ORDER — HYDROCHLOROTHIAZIDE 25 MG PO TABS: ORAL_TABLET | ORAL | 1 refills | Status: DC

## 2016-07-23 MED ORDER — LOSARTAN POTASSIUM 100 MG PO TABS: 100.0000 mg | ORAL_TABLET | Freq: Every day | ORAL | 1 refills | Status: DC

## 2016-07-23 NOTE — Progress Notes (Signed)
   Subjective:    Patient ID: XAI MUSCATO, male    DOB: 03-04-1956, 61 y.o.   MRN: [id number]  Hypertension  This is a chronic problem. The current episode started more than 1 year ago. Treatments tried: cozaar, hctz. There are no compliance problems.    Blood pressure medicine and blood pressure levels reviewed today with patient. Compliant with blood pressure medicine. States does not miss a dose. No obvious side effects. Blood pressure generally good when checked elsewhere. Watching salt intake.       Review of Systems No headache, no major weight loss or weight gain, no chest pain no back pain abdominal pain no change in bowel habits complete ROS otherwise negative     Objective:   Physical Exam Alert vitals stable, NAD. Blood pressure good on repeat. HEENT normal. Lungs clear. Heart regular rate and rhythm.        Assessment & Plan:  Impression 1 hypertension good control discussed plan died discuss exercise discussed medication refill. Lopid 6 months for wellness plus chronic visit

## 2016-10-02 ENCOUNTER — Encounter: Payer: Self-pay | Admitting: Family Medicine

## 2017-01-06 ENCOUNTER — Telehealth: Payer: Self-pay | Admitting: Family Medicine

## 2017-01-06 DIAGNOSIS — I10 Essential (primary) hypertension: Secondary | ICD-10-CM

## 2017-01-06 DIAGNOSIS — Z13220 Encounter for screening for lipoid disorders: Secondary | ICD-10-CM

## 2017-01-06 DIAGNOSIS — R972 Elevated prostate specific antigen [PSA]: Secondary | ICD-10-CM

## 2017-01-06 DIAGNOSIS — Z79899 Other long term (current) drug therapy: Secondary | ICD-10-CM

## 2017-01-06 NOTE — Telephone Encounter (Signed)
Patient with Lipid, Liver, Met 7, HgbA1c and PSA 01/2016

## 2017-01-06 NOTE — Telephone Encounter (Signed)
Requesting labs to be ordered to have done before appointment on 02/02/17 with Dr. Richardson Landry.

## 2017-01-06 NOTE — Telephone Encounter (Signed)
same

## 2017-01-07 NOTE — Telephone Encounter (Signed)
I called and left a detailed message that the orders for the pre ov lab work have been sent to [facility] and he may go have them drawn prior to the appt on 02/02/2017.

## 2017-01-14 ENCOUNTER — Other Ambulatory Visit: Payer: Self-pay | Admitting: Family Medicine

## 2017-01-21 ENCOUNTER — Encounter: Payer: BLUE CROSS/BLUE SHIELD | Admitting: Family Medicine

## 2017-01-29 ENCOUNTER — Encounter: Payer: BLUE CROSS/BLUE SHIELD | Admitting: Family Medicine

## 2017-02-02 ENCOUNTER — Encounter: Payer: Self-pay | Admitting: Family Medicine

## 2017-02-02 ENCOUNTER — Ambulatory Visit (INDEPENDENT_AMBULATORY_CARE_PROVIDER_SITE_OTHER): Payer: BLUE CROSS/BLUE SHIELD | Admitting: Family Medicine

## 2017-02-02 VITALS — BP 126/76 | Ht 69.0 in | Wt 245.2 lb

## 2017-02-02 DIAGNOSIS — Z0000 Encounter for general adult medical examination without abnormal findings: Secondary | ICD-10-CM

## 2017-02-02 LAB — BASIC METABOLIC PANEL
BUN/Creatinine Ratio: 14 (ref 10–24)
BUN: 13 mg/dL (ref 8–27)
CO2: 24 mmol/L (ref 20–29)
Calcium: 9.1 mg/dL (ref 8.6–10.2)
Chloride: 101 mmol/L (ref 96–106)
Creatinine, Ser: 0.96 mg/dL (ref 0.76–1.27)
GFR calc Af Amer: 99 mL/min/{1.73_m2} (ref >59)
GFR calc non Af Amer: 86 mL/min/{1.73_m2} (ref >59)
Glucose: 125 mg/dL — ABNORMAL HIGH (ref 65–99)
Potassium: 3.6 mmol/L (ref 3.5–5.2)
Sodium: 142 mmol/L (ref 134–144)

## 2017-02-02 LAB — HEPATIC FUNCTION PANEL
ALT: 38 IU/L (ref 0–44)
AST: 30 IU/L (ref 0–40)
Albumin: 4.5 g/dL (ref 3.6–4.8)
Alkaline Phosphatase: 75 IU/L (ref 39–117)
Bilirubin Total: 0.8 mg/dL (ref 0.0–1.2)
Bilirubin, Direct: 0.2 mg/dL (ref 0.00–0.40)
Total Protein: 7.3 g/dL (ref 6.0–8.5)

## 2017-02-02 LAB — LIPID PANEL
Chol/HDL Ratio: 4.1 ratio (ref 0.0–5.0)
Cholesterol, Total: 139 mg/dL (ref 100–199)
HDL: 34 mg/dL — ABNORMAL LOW (ref >39)
LDL Calculated: 71 mg/dL (ref 0–99)
Triglycerides: 169 mg/dL — ABNORMAL HIGH (ref 0–149)
VLDL Cholesterol Cal: 34 mg/dL (ref 5–40)

## 2017-02-02 LAB — PSA: Prostate Specific Ag, Serum: 0.9 ng/mL (ref 0.0–4.0)

## 2017-02-02 MED ORDER — LOSARTAN POTASSIUM 100 MG PO TABS: 100.0000 mg | ORAL_TABLET | Freq: Every day | ORAL | 1 refills | Status: DC

## 2017-02-02 MED ORDER — HYDROCHLOROTHIAZIDE 25 MG PO TABS: ORAL_TABLET | ORAL | 1 refills | Status: DC

## 2017-02-02 NOTE — Progress Notes (Signed)
Subjective:    Patient ID: Oscar Campbell, male    DOB: 12-03-1955, 61 y.o.   MRN: [id number]  HPI The patient comes in today for a wellness visit.    A review of their health history was completed.  A review of medications was also completed.  Any needed refills;  Yes   Eating habits:  Patient states eating habits are decent.   Falls/  MVA accidents in past few months: None  Regular exercise: States tries to exercise. Walks periodically.  Specialist pt sees on regular basis: Urology ( Dr.Mckinney)  Preventative health issues were discussed.    Additional concerns: Patient states no other concerns this visit.   Results for orders placed or performed in visit on 01/06/17  Lipid panel  Result Value Ref Range   Cholesterol, Total 139 100 - 199 mg/dL   Triglycerides 169 (H) 0 - 149 mg/dL   HDL 34 (L) >39 mg/dL   VLDL Cholesterol Cal 34 5 - 40 mg/dL   LDL Calculated 71 0 - 99 mg/dL   Chol/HDL Ratio 4.1 0.0 - 5.0 ratio  Hepatic function panel  Result Value Ref Range   Total Protein 7.3 6.0 - 8.5 g/dL   Albumin 4.5 3.6 - 4.8 g/dL   Bilirubin Total 0.8 0.0 - 1.2 mg/dL   Bilirubin, Direct 0.20 0.00 - 0.40 mg/dL   Alkaline Phosphatase 75 39 - 117 IU/L   AST 30 0 - 40 IU/L   ALT 38 0 - 44 IU/L  Basic metabolic panel  Result Value Ref Range   Glucose 125 (H) 65 - 99 mg/dL   BUN 13 8 - 27 mg/dL   Creatinine, Ser 0.96 0.76 - 1.27 mg/dL   GFR calc non Af Amer 86 >59 mL/min/1.73   GFR calc Af Amer 99 >59 mL/min/1.73   BUN/Creatinine Ratio 14 10 - 24   Sodium 142 134 - 144 mmol/L   Potassium 3.6 3.5 - 5.2 mmol/L   Chloride 101 96 - 106 mmol/L   CO2 24 20 - 29 mmol/L   Calcium 9.1 8.6 - 10.2 mg/dL  PSA  Result Value Ref Range   Prostate Specific Ag, Serum 0.9 0.0 - 4.0 ng/mL   Blood pressure medicine and blood pressure levels reviewed today with patient. Compliant with blood pressure medicine. States does not miss a dose. No obvious side effects. Blood pressure generally  good when checked elsewhere. Watching salt intake.    Flu shot due soon    Next colon due at age 26   Overall ave diet, has backed off on sweets no more sweet tea,   Pos fam hx of diab. Has hx of dia in fanily  Review of Systems  Constitutional: Negative for activity change, appetite change and fever.  HENT: Negative for congestion and rhinorrhea.   Eyes: Negative for discharge.  Respiratory: Negative for cough and wheezing.   Cardiovascular: Negative for chest pain.  Gastrointestinal: Negative for abdominal pain, blood in stool and vomiting.  Genitourinary: Negative for difficulty urinating and frequency.  Musculoskeletal: Negative for neck pain.  Skin: Negative for rash.  Allergic/Immunologic: Negative for environmental allergies and food allergies.  Neurological: Negative for weakness and headaches.  Psychiatric/Behavioral: Negative for agitation.  All other systems reviewed and are negative.      Objective:   Physical Exam  Constitutional: He appears well-developed and well-nourished.  HENT:  Head: Normocephalic and atraumatic.  Right Ear: External ear normal.  Left Ear: External ear normal.  Nose: Nose  normal.  Mouth/Throat: Oropharynx is clear and moist.  Eyes: Pupils are equal, round, and reactive to light. EOM are normal.  Neck: Normal range of motion. Neck supple. No thyromegaly present.  Cardiovascular: Normal rate, regular rhythm and normal heart sounds.   No murmur heard. Pulmonary/Chest: Effort normal and breath sounds normal. No respiratory distress. He has no wheezes.  Abdominal: Soft. Bowel sounds are normal. He exhibits no distension and no mass. There is no tenderness.  Genitourinary: Penis normal.  Genitourinary Comments: Prost exam wnl  Musculoskeletal: Normal range of motion. He exhibits no edema.  Lymphadenopathy:    He has no cervical adenopathy.  Neurological: He is alert. He exhibits normal muscle tone.  Skin: Skin is warm and dry. No  erythema.  Psychiatric: He has a normal mood and affect. His behavior is normal. Judgment normal.  Vitals reviewed.         Assessment & Plan:  Impression 1 wellness exam. Shingles vaccine prescribed and recommended. Up-to-date on colonoscopy. Diet exercise discussed. Patient to get flu shot elsewhere #2 hypertension good control discussed maintain same meds plan as noted above

## 2017-02-07 ENCOUNTER — Encounter: Payer: Self-pay | Admitting: Family Medicine

## 2017-02-25 ENCOUNTER — Ambulatory Visit (INDEPENDENT_AMBULATORY_CARE_PROVIDER_SITE_OTHER): Payer: BLUE CROSS/BLUE SHIELD | Admitting: Urology

## 2017-02-25 DIAGNOSIS — N200 Calculus of kidney: Secondary | ICD-10-CM | POA: Diagnosis not present

## 2017-03-13 ENCOUNTER — Ambulatory Visit (HOSPITAL_COMMUNITY)
Admission: RE | Admit: 2017-03-13 | Discharge: 2017-03-13 | Disposition: A | Discharge status: Home / Self Care | Payer: BLUE CROSS/BLUE SHIELD | Source: Ambulatory Visit | Attending: Urology | Admitting: Urology

## 2017-03-13 ENCOUNTER — Other Ambulatory Visit: Payer: Self-pay | Admitting: Urology

## 2017-03-13 DIAGNOSIS — N200 Calculus of kidney: Secondary | ICD-10-CM | POA: Diagnosis present

## 2017-03-20 IMAGING — DX DG ABDOMEN 1V
2 series · 2 of 2 positions shown · non-contrast
Comparison: Ultrasound 07/02/2015

CLINICAL DATA: Right side kidney stone.

EXAM:
ABDOMEN - 1 VIEW

[abdomen kub (1 of 2)]
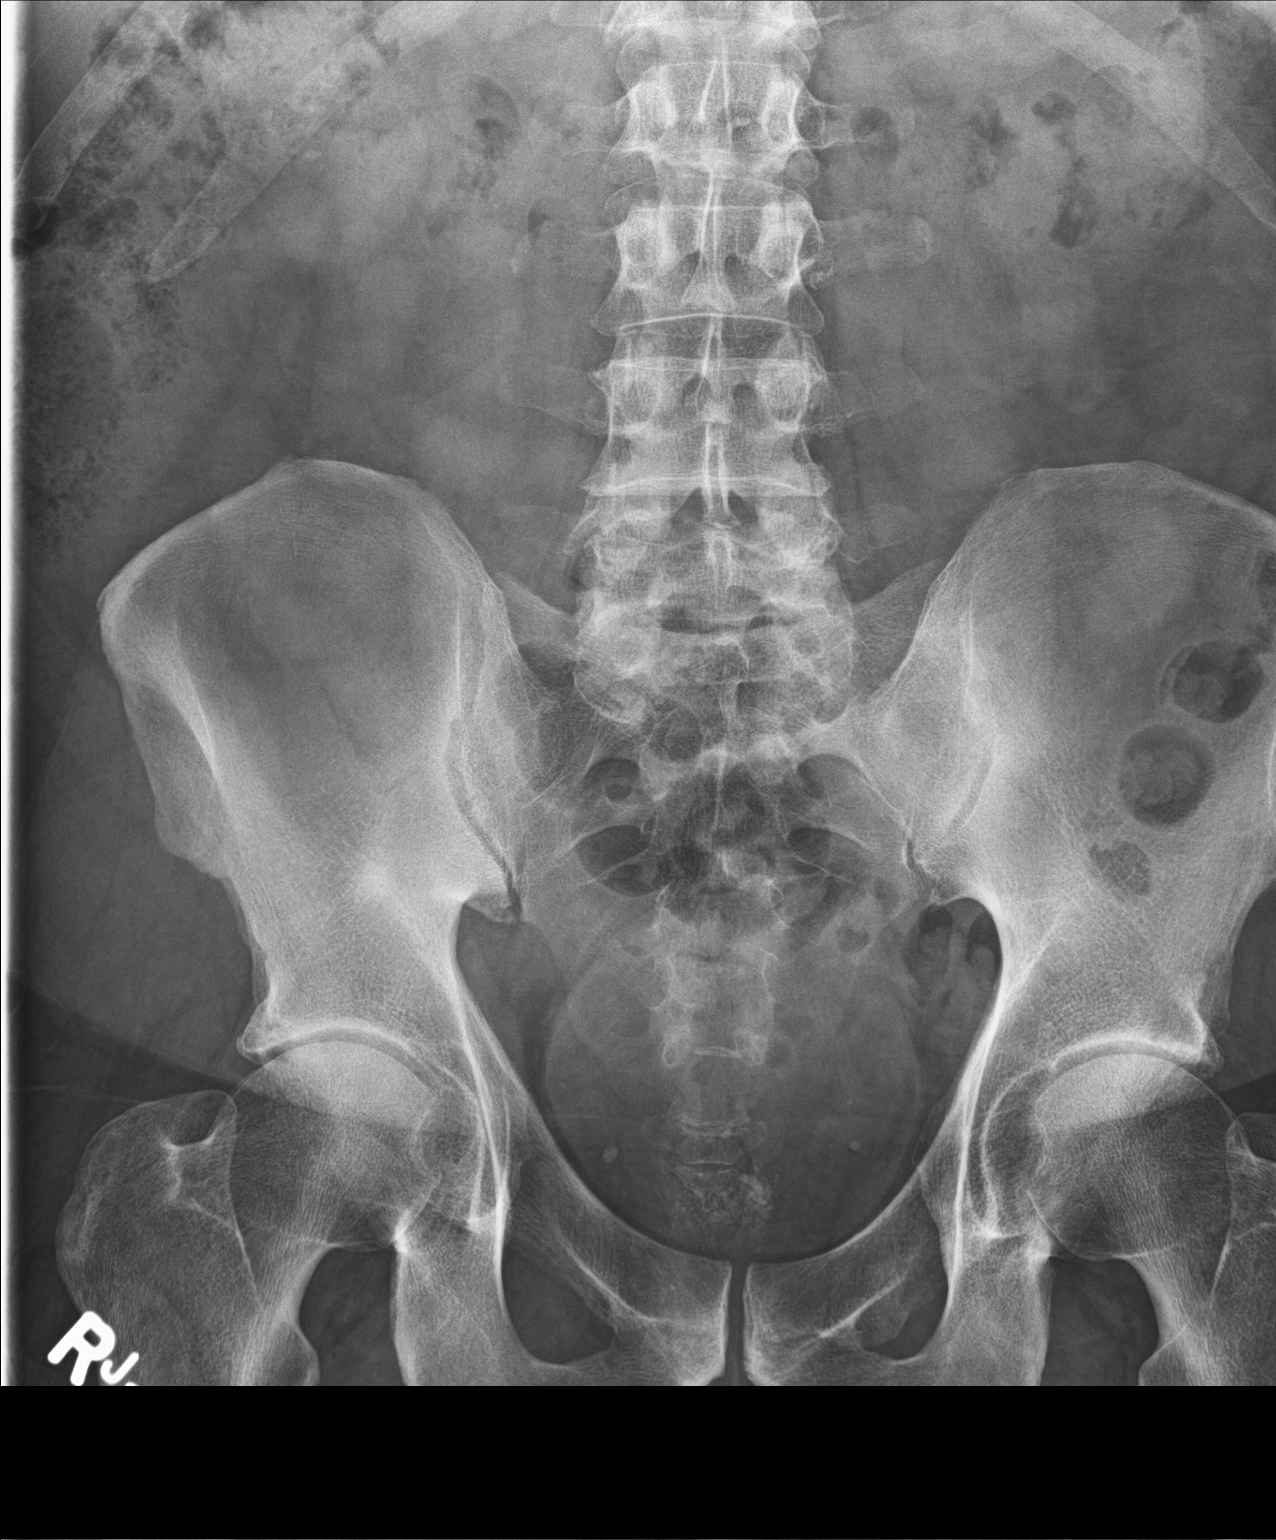

[abdomen kub (2 of 2)]
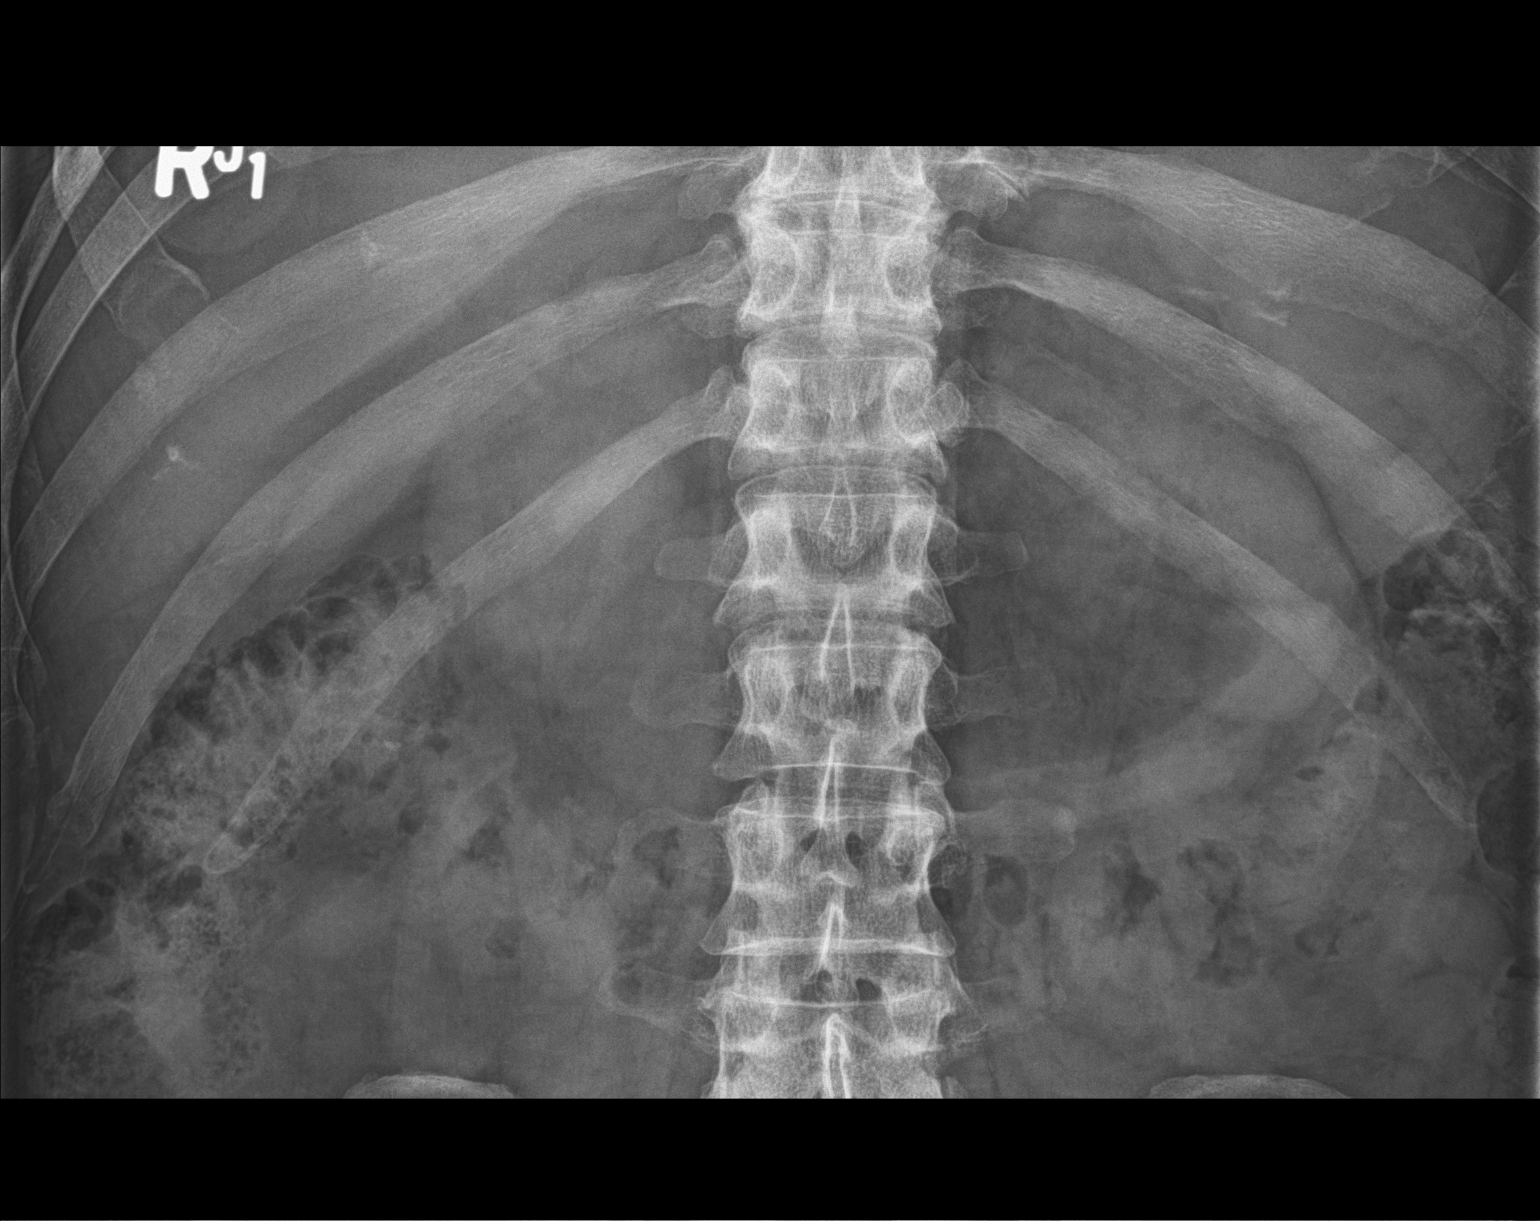

[2 of 2 positions shown; findings below may reference images not displayed]

FINDINGS: Calcification projects over the lower pole of the right kidney as
seen on prior ultrasound. No additional suspicious calcifications.
Nonobstructive bowel gas pattern with moderate stool in the colon.
No free air. No acute bony abnormality.
IMPRESSION: Right lower pole nephrolithiasis.

## 2017-03-24 ENCOUNTER — Other Ambulatory Visit: Payer: Self-pay | Admitting: Urology

## 2017-03-24 DIAGNOSIS — N200 Calculus of kidney: Secondary | ICD-10-CM

## 2017-03-27 ENCOUNTER — Ambulatory Visit (HOSPITAL_COMMUNITY)
Admission: RE | Admit: 2017-03-27 | Discharge: 2017-03-27 | Disposition: A | Discharge status: Home / Self Care | Payer: BLUE CROSS/BLUE SHIELD | Source: Ambulatory Visit | Attending: Urology | Admitting: Urology

## 2017-03-27 DIAGNOSIS — N200 Calculus of kidney: Secondary | ICD-10-CM | POA: Diagnosis present

## 2017-03-27 DIAGNOSIS — N281 Cyst of kidney, acquired: Secondary | ICD-10-CM | POA: Diagnosis not present

## 2017-04-10 ENCOUNTER — Other Ambulatory Visit: Payer: Self-pay | Admitting: Family Medicine

## 2017-06-19 IMAGING — US US RENAL
2 series · 14 of 25 positions shown · non-contrast
Comparison: Ultrasound July 02, 2015.

CLINICAL DATA: Nephrolithiasis.

EXAM:
RENAL / URINARY TRACT ULTRASOUND COMPLETE

[Series 1: us renal · 0.31mm/px · 13 of 55 slices shown (1 of 2)]
[im 1/55]
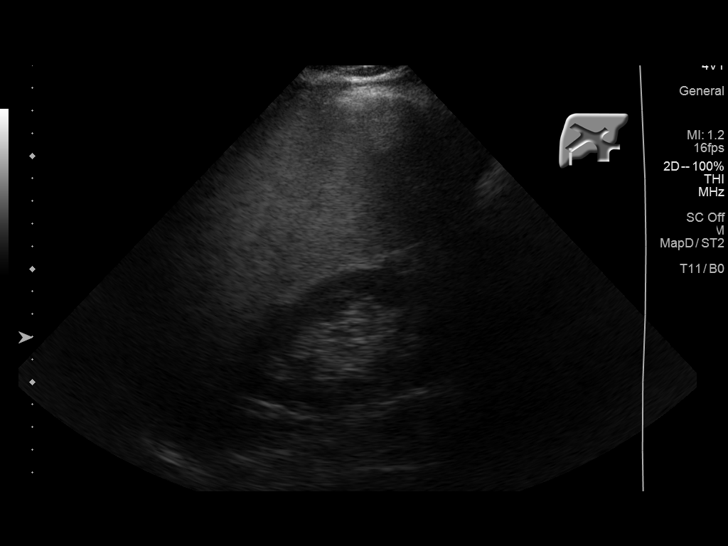
[im 5/55]
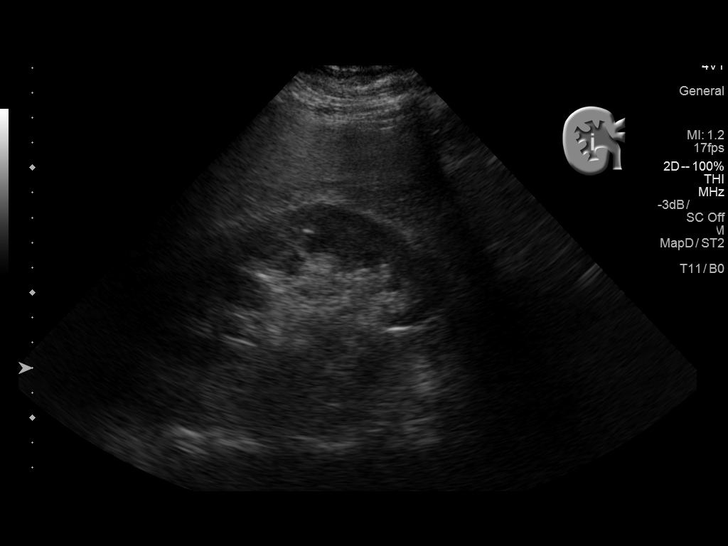
[im 10/55]
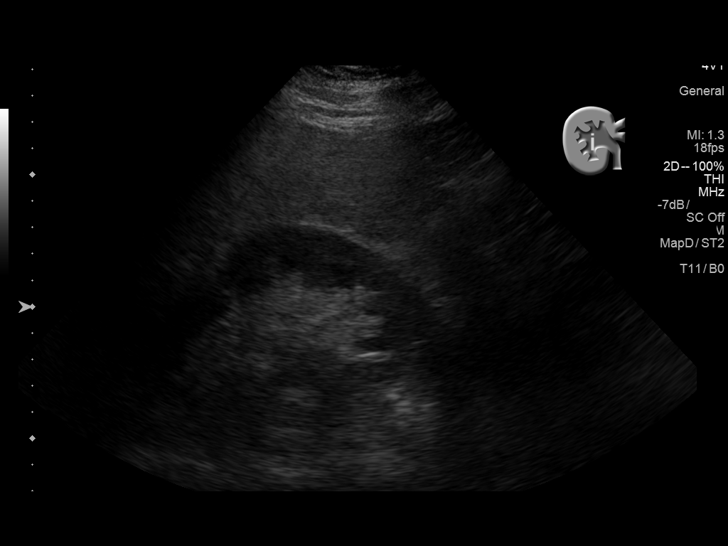
[im 15/55]
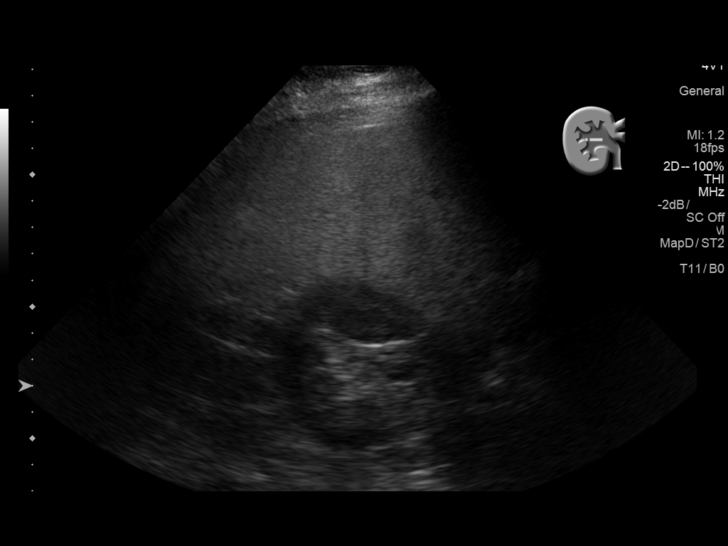
[im 19/55]
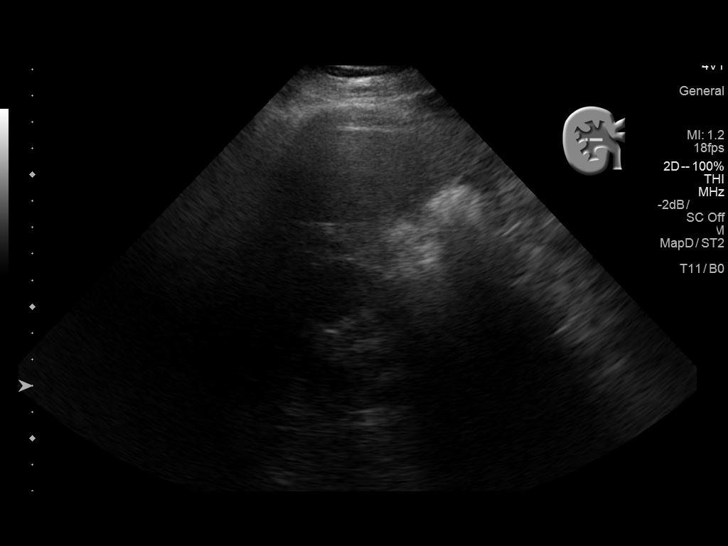
[im 22/55]
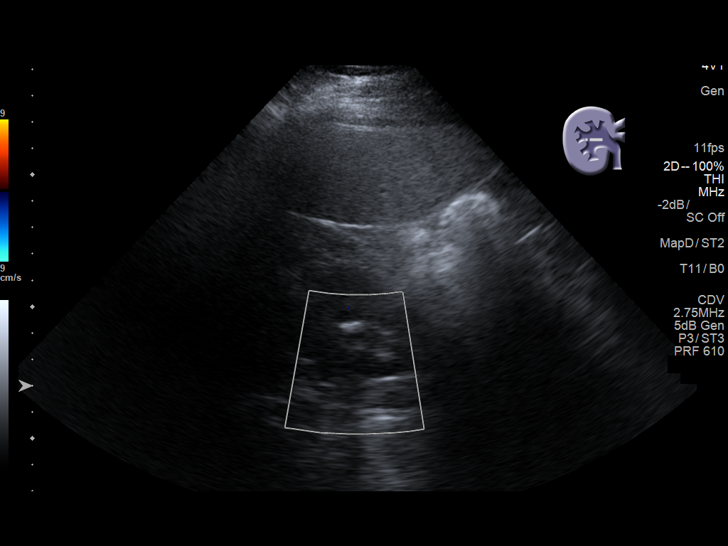
[im 26/55]
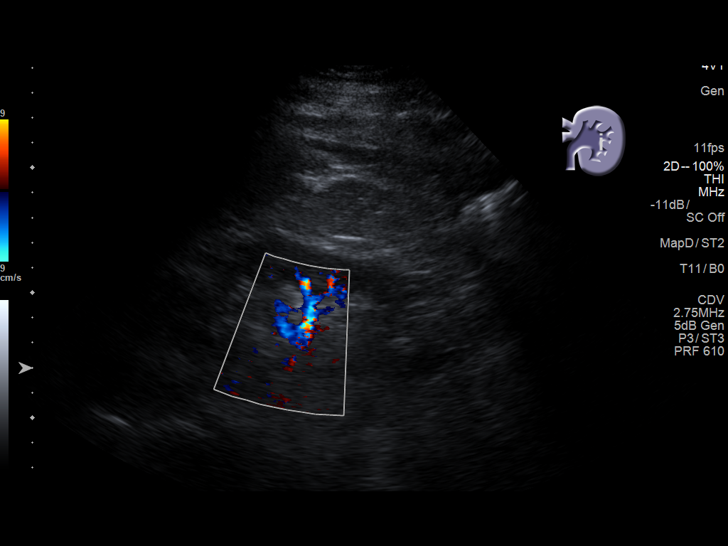
[im 31/55]
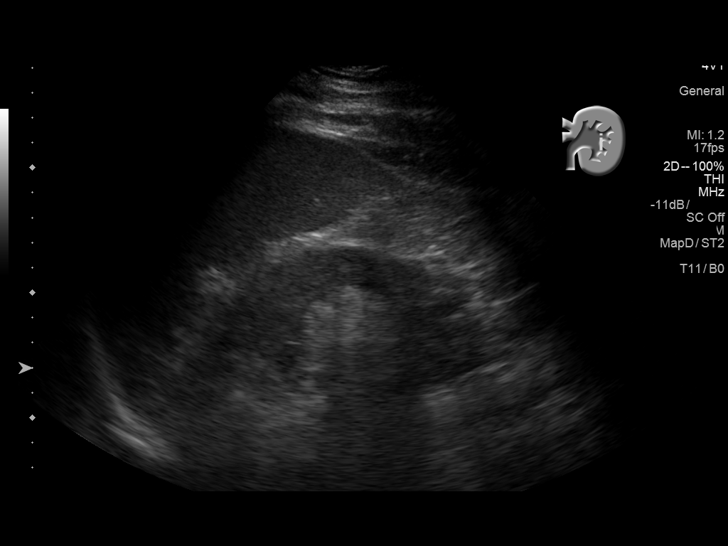
[im 36/55]
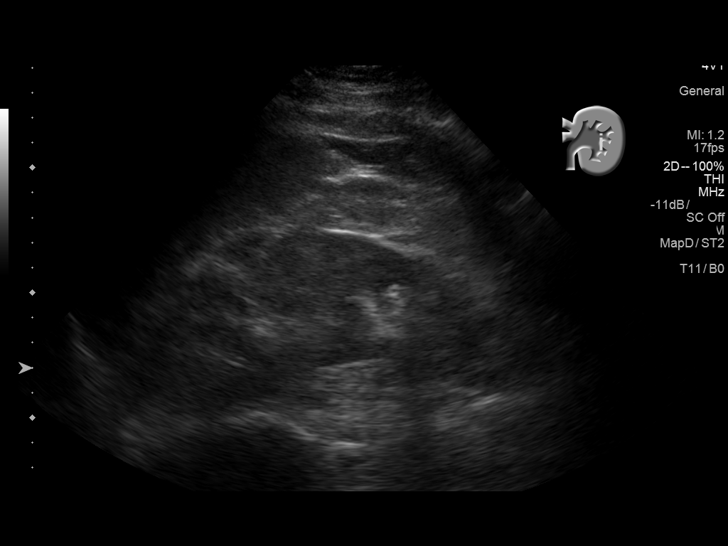
[im 38/55]
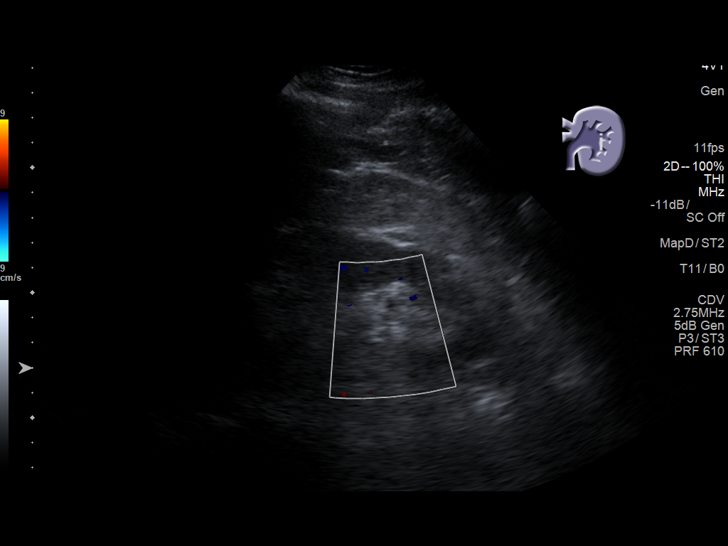
[im 43/55]
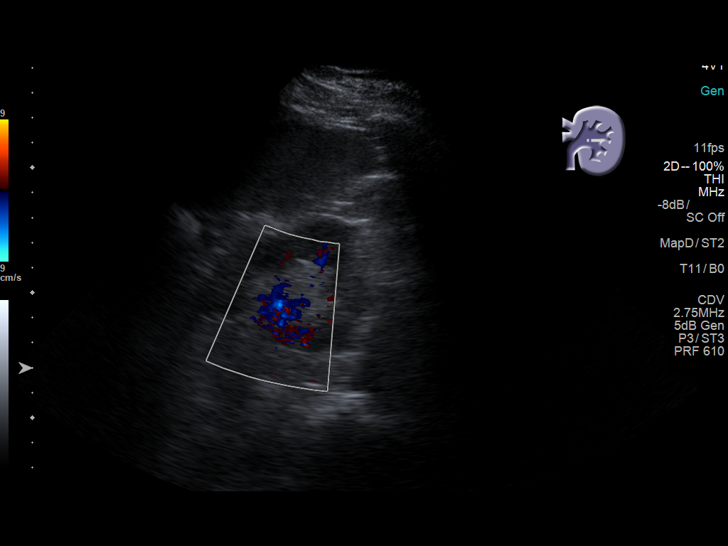
[im 47/55]
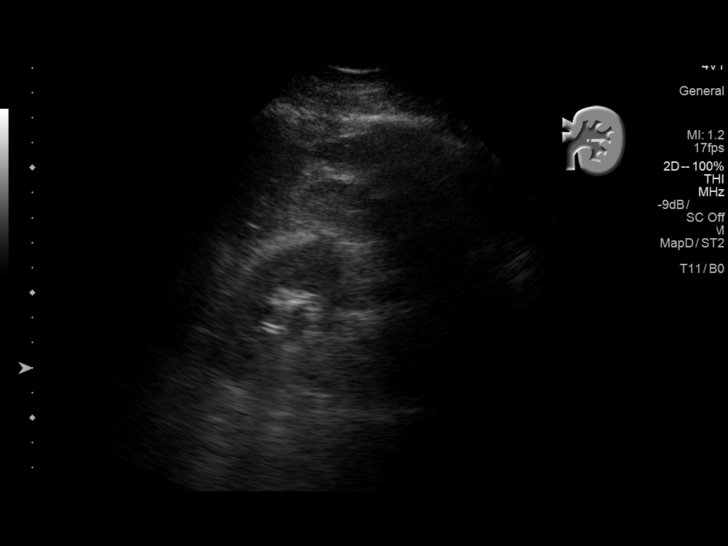
[im 52/55]
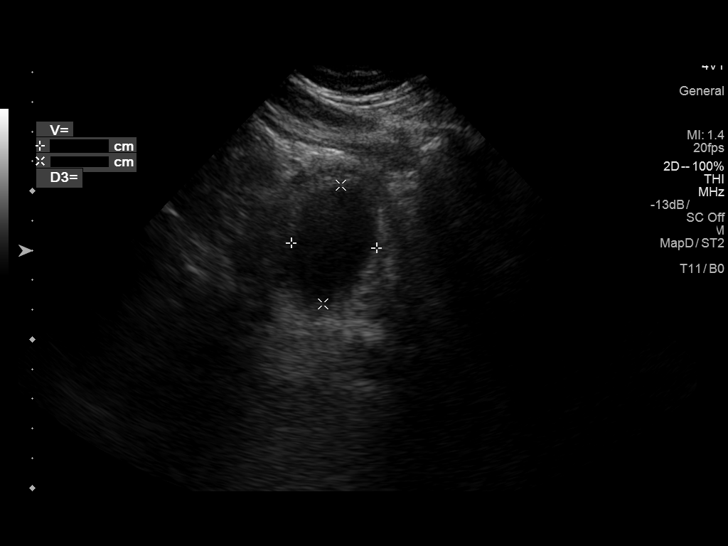

[Series 3: us renal · 0.24mm/px · 1 of 1 slices shown (2 of 2)]
[im 1/1]
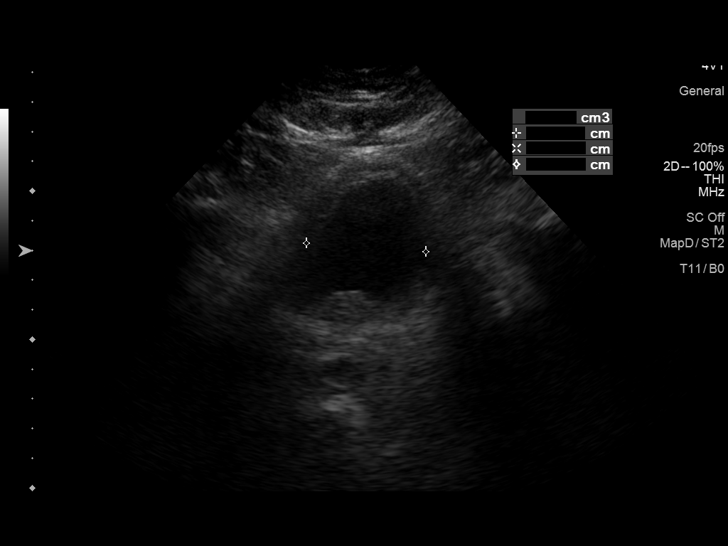

[14 of 25 positions shown; findings below may reference images not displayed]

FINDINGS: Right Kidney:

Length: 12.1 cm. 9 mm nonobstructive calculus seen in lower pole.
Echogenicity within normal limits. No mass or hydronephrosis
visualized.

Left Kidney:

Length: 13.1 cm. 1.2 cm cyst is seen in upper pole. 6 mm
nonobstructive calculus seen in lower pole. Echogenicity within
normal limits. No mass or hydronephrosis visualized.

Bladder:

Appears normal for degree of bladder distention. Bilateral ureteral
jets are noted. Calculated prevoid volume of 446 mL. Calculated
postvoid volume of 25 mL.
IMPRESSION: Bilateral nonobstructive nephrolithiasis. No hydronephrosis or renal
obstruction is noted.

## 2017-07-15 ENCOUNTER — Other Ambulatory Visit: Payer: Self-pay | Admitting: Family Medicine

## 2017-08-04 ENCOUNTER — Ambulatory Visit (INDEPENDENT_AMBULATORY_CARE_PROVIDER_SITE_OTHER): Payer: BLUE CROSS/BLUE SHIELD | Admitting: Family Medicine

## 2017-08-04 ENCOUNTER — Encounter: Payer: Self-pay | Admitting: Family Medicine

## 2017-08-04 VITALS — BP 150/88 | Ht 69.0 in | Wt 245.0 lb

## 2017-08-04 DIAGNOSIS — I10 Essential (primary) hypertension: Secondary | ICD-10-CM

## 2017-08-04 MED ORDER — HYDROCHLOROTHIAZIDE 25 MG PO TABS: ORAL_TABLET | ORAL | 1 refills | Status: DC

## 2017-08-04 MED ORDER — LOSARTAN POTASSIUM 100 MG PO TABS: 100.0000 mg | ORAL_TABLET | Freq: Every day | ORAL | 1 refills | Status: DC

## 2017-08-04 NOTE — Progress Notes (Signed)
   Subjective:    Patient ID: Oscar Campbell, male    DOB: Jul 12, 1955, 62 y.o.   MRN: [id number]  HPI Patient is here today to follow up on Htn. Currently on HCTZ 25 mg 1/2 in am and Losartan 100 mg one daily.He states he eats healthy and does not exercise,except for at work.  Some family stress with fa's sister  Pt was 15  Exercise reg at work  Wife getting teeth pulled in tomorrow   Blood pressure medicine and blood pressure levels reviewed today with patient. Compliant with blood pressure medicine. States does not miss a dose. No obvious side effects. Blood pressure generally good when checked elsewhere. Watching salt intake.     Review of Systems No headache, no major weight loss or weight gain, no chest pain no back pain abdominal pain no change in bowel habits complete ROS otherwise negative     Objective:   Physical Exam    Alert vitals stable, NAD. Blood pressure good on repeat. HEENT normal. Lungs clear. Heart regular rate and rhythm.     Assessment & Plan:  Impression hypertension.  Discussed exercise discussed.  Continue same

## 2017-09-18 ENCOUNTER — Telehealth: Payer: Self-pay | Admitting: Family Medicine

## 2017-09-18 MED ORDER — AZITHROMYCIN 250 MG PO TABS: ORAL_TABLET | ORAL | 0 refills | Status: DC

## 2017-09-18 NOTE — Telephone Encounter (Signed)
Also:  Patient has been using Flonase but that hasn't really helped much.

## 2017-09-18 NOTE — Telephone Encounter (Signed)
May send in Tranquillity, patient should follow-up if any progressive symptoms-please send a copy of this to Dr. Richardson Landry as well

## 2017-09-18 NOTE — Telephone Encounter (Signed)
Patient called in to make a payment on his payment plan and then asked if we could call in a Z-pak for a sinus infection.  He said he can't afford to come in and he knows it's just sinus.  He has no fever, just heavy congestion and he is blowing out green mucous.  I told him he would probably have to be seen for an antibiotic and he said he couldn't come in. Last seen on 08-04-17 for htn.  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

## 2017-09-18 NOTE — Telephone Encounter (Signed)
Pt is aware of all.

## 2017-09-18 NOTE — Telephone Encounter (Signed)
I called to see if running fever. No answer asked that r/c.

## 2017-09-18 NOTE — Telephone Encounter (Signed)
Patient states he is not running a fever. I advised we normally do not see sight unseen. He states Dr.Steve has done this in the past.Please advise.

## 2017-09-25 IMAGING — DX DG ABDOMEN 1V
1 series · 1 of 1 positions shown · non-contrast
Comparison: Radiographs September 10, 2015.

CLINICAL DATA: Nephrolithiasis.

EXAM:
ABDOMEN - 1 VIEW

[abdomen kub]
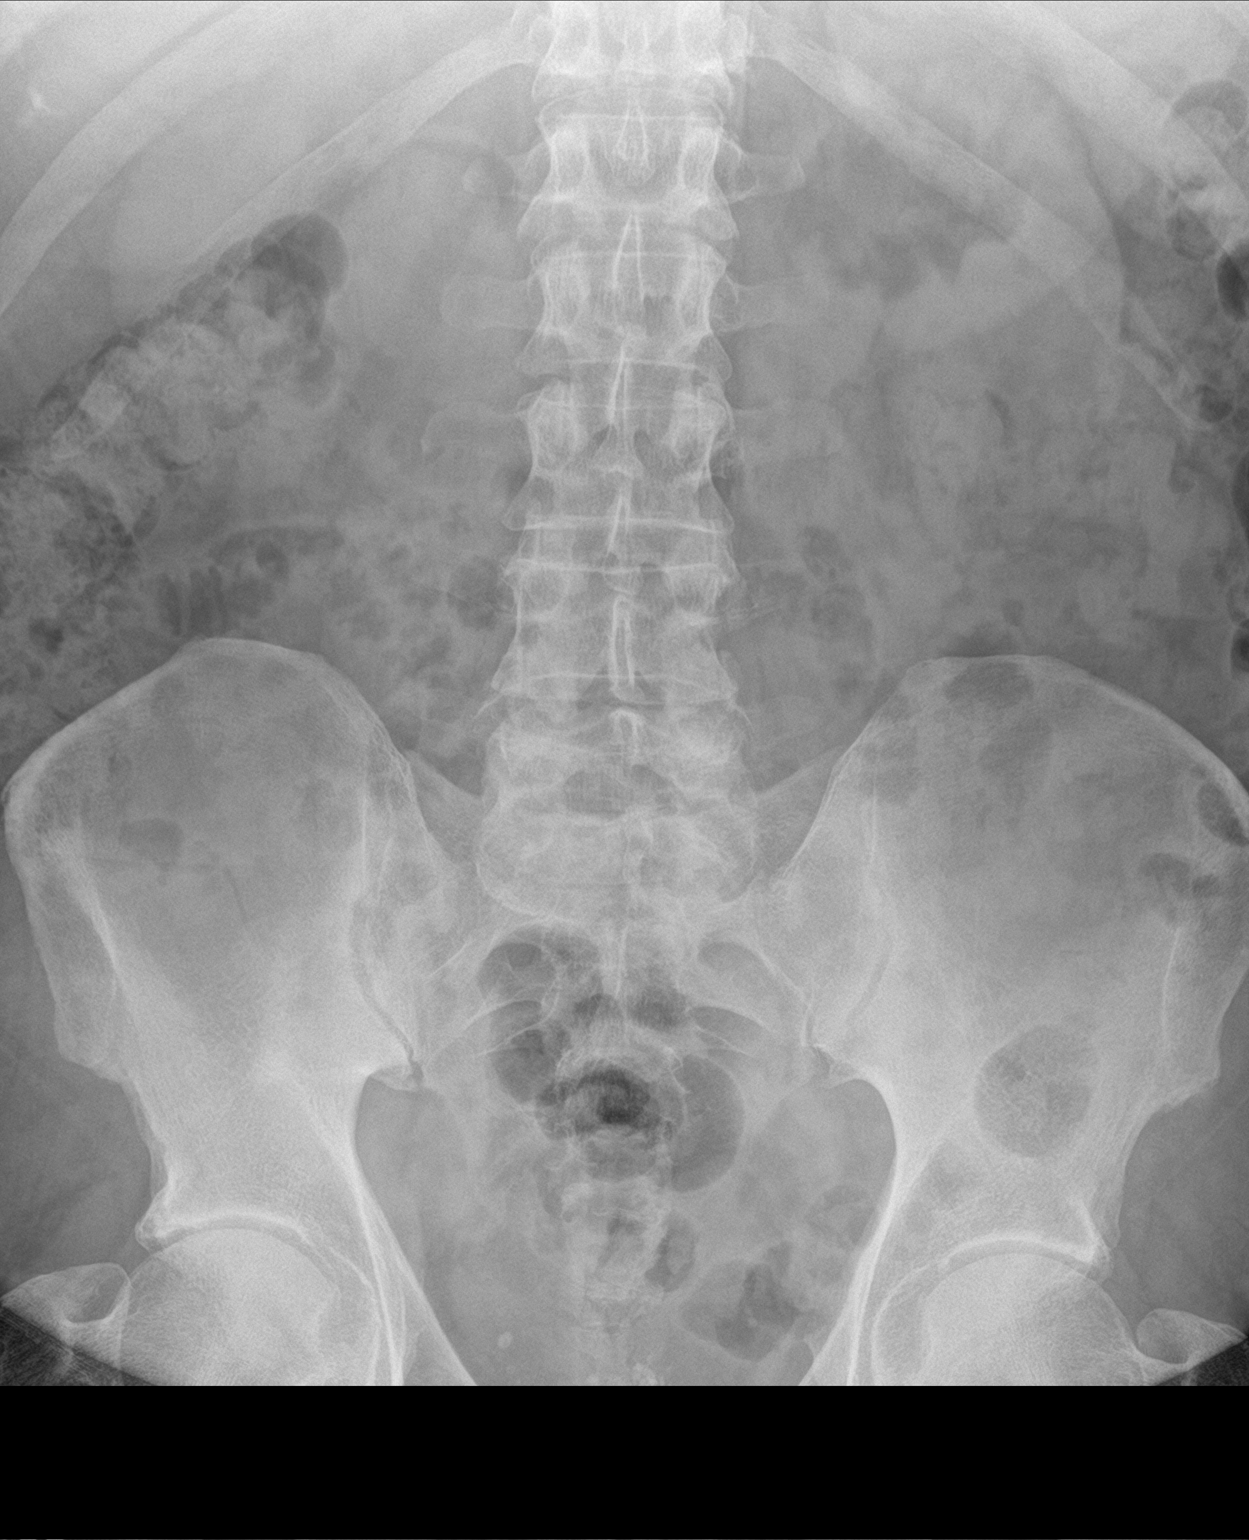

[1 of 1 positions shown; findings below may reference images not displayed]

FINDINGS: The bowel gas pattern is normal. Stable right nephrolithiasis is
noted. Phleboliths are noted in the pelvis.
IMPRESSION: Stable right nephrolithiasis. No evidence of bowel obstruction or
ileus.

## 2017-10-06 ENCOUNTER — Encounter: Payer: Self-pay | Admitting: Family Medicine

## 2017-10-06 ENCOUNTER — Ambulatory Visit (INDEPENDENT_AMBULATORY_CARE_PROVIDER_SITE_OTHER): Payer: BLUE CROSS/BLUE SHIELD | Admitting: Family Medicine

## 2017-10-06 VITALS — BP 148/88 | Temp 98.1°F | Ht 69.0 in | Wt 247.0 lb

## 2017-10-06 DIAGNOSIS — M7042 Prepatellar bursitis, left knee: Secondary | ICD-10-CM

## 2017-10-06 MED ORDER — ETODOLAC 400 MG PO TABS: 400.0000 mg | ORAL_TABLET | Freq: Two times a day (BID) | ORAL | 1 refills | Status: DC

## 2017-10-06 NOTE — Patient Instructions (Signed)
Prepatellar bursitis

## 2017-10-06 NOTE — Progress Notes (Signed)
   Subjective:    Patient ID: Oscar Campbell, male    DOB: Aug 29, 1955, 62 y.o.   MRN: [id number]  HPI Patient is here today with complaints of left swollen knee. 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  Last wed was sorking on a concrete pad, got to hurting  Got to hurt took 800 ib three times per day   Limping,  Pain with movemtn backwares   No majot injuties of the knees   Twenty min of croucing on knee on conrete   Review of Systems No headache, no major weight loss or weight gain, no chest pain no back pain abdominal pain no change in bowel habits complete ROS otherwise negative     Objective:   Physical Exam  Alert vitals stable, NAD. Blood pressure good on repeat. HEENT normal. Lungs clear. Heart regular rate and rhythm. Left knee no joint laxity no medial joint line tenderness no posterior tenderness positive prepatellar swelling evident      Assessment & Plan:  1 impression prepatellar bursitis discussed nature of injury discussed and r anti-inflammatory medicine ecommended expect slow resolution

## 2017-10-26 ENCOUNTER — Ambulatory Visit (HOSPITAL_COMMUNITY)
Admission: RE | Admit: 2017-10-26 | Discharge: 2017-10-26 | Disposition: A | Discharge status: Home / Self Care | Payer: BLUE CROSS/BLUE SHIELD | Source: Ambulatory Visit | Attending: Family Medicine | Admitting: Family Medicine

## 2017-10-26 ENCOUNTER — Ambulatory Visit (INDEPENDENT_AMBULATORY_CARE_PROVIDER_SITE_OTHER): Payer: BLUE CROSS/BLUE SHIELD | Admitting: Family Medicine

## 2017-10-26 ENCOUNTER — Encounter: Payer: Self-pay | Admitting: Family Medicine

## 2017-10-26 VITALS — BP 136/94 | Temp 98.2°F | Ht 69.0 in | Wt 248.0 lb

## 2017-10-26 DIAGNOSIS — M25562 Pain in left knee: Secondary | ICD-10-CM | POA: Diagnosis not present

## 2017-10-26 DIAGNOSIS — M1712 Unilateral primary osteoarthritis, left knee: Secondary | ICD-10-CM | POA: Insufficient documentation

## 2017-10-26 DIAGNOSIS — M25462 Effusion, left knee: Secondary | ICD-10-CM | POA: Insufficient documentation

## 2017-10-26 MED ORDER — NABUMETONE 750 MG PO TABS: ORAL_TABLET | ORAL | 0 refills | Status: DC

## 2017-10-26 NOTE — Progress Notes (Signed)
   Subjective:    Patient ID: Oscar Campbell, male    DOB: [birth year], 62 y.o.   MRN: [id number]  Knee Pain   The incident occurred more than 1 week ago. The incident occurred at work. He has tried NSAIDs (IBU 800; Lyles ) for the symptoms. The treatment provided moderate relief.  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 States that the pain is in the knee and is tender. Pt did get brace from [facility]. Pt does not want injections.   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 Blood pressure good on repeat. HEENT normal. Lungs clear. Heart regular rate and rhythm.   Left knee mild effusion.  No obvious joint laxity.  Some medial joint line tenderness.      Assessment & Plan:  Impression subacute knee pain.  Likely element of arthritis.  However concerning for potential meniscal injury.  Discussed.  Trial of additional months with medicine x-ray involved knee.  Orthopedic referral rationale discussed

## 2017-11-04 ENCOUNTER — Encounter: Payer: Self-pay | Admitting: Family Medicine

## 2017-11-04 ENCOUNTER — Encounter (INDEPENDENT_AMBULATORY_CARE_PROVIDER_SITE_OTHER): Payer: Self-pay

## 2018-02-03 ENCOUNTER — Encounter: Payer: BLUE CROSS/BLUE SHIELD | Admitting: Family Medicine

## 2018-02-18 ENCOUNTER — Encounter: Payer: Self-pay | Admitting: Family Medicine

## 2018-02-18 ENCOUNTER — Ambulatory Visit (INDEPENDENT_AMBULATORY_CARE_PROVIDER_SITE_OTHER): Payer: BLUE CROSS/BLUE SHIELD | Admitting: Family Medicine

## 2018-02-18 VITALS — BP 142/86 | Ht 69.0 in | Wt 250.0 lb

## 2018-02-18 DIAGNOSIS — Z13220 Encounter for screening for lipoid disorders: Secondary | ICD-10-CM | POA: Diagnosis not present

## 2018-02-18 DIAGNOSIS — I10 Essential (primary) hypertension: Secondary | ICD-10-CM | POA: Diagnosis not present

## 2018-02-18 DIAGNOSIS — Z0000 Encounter for general adult medical examination without abnormal findings: Secondary | ICD-10-CM | POA: Diagnosis not present

## 2018-02-18 DIAGNOSIS — Z125 Encounter for screening for malignant neoplasm of prostate: Secondary | ICD-10-CM | POA: Diagnosis not present

## 2018-02-18 MED ORDER — ZOSTER VAC RECOMB ADJUVANTED 50 MCG/0.5ML IM SUSR: 0.5000 mL | Freq: Once | INTRAMUSCULAR | 1 refills | Status: AC

## 2018-02-18 MED ORDER — LOSARTAN POTASSIUM 100 MG PO TABS: 100.0000 mg | ORAL_TABLET | Freq: Every day | ORAL | 1 refills | Status: DC

## 2018-02-18 MED ORDER — HYDROCHLOROTHIAZIDE 25 MG PO TABS: ORAL_TABLET | ORAL | 1 refills | Status: DC

## 2018-02-18 NOTE — Progress Notes (Signed)
   Subjective:    Patient ID: Oscar Campbell, male    DOB: [birth year], 62 y.o.   MRN: [id number]  HPI The patient comes in today for a wellness visit.    A review of their health history was completed.  A review of medications was also completed.  Any needed refills; none  Eating habits: health conscious  Falls/  MVA accidents in past few months: none  Regular exercise: yes walking  Specialist pt sees on regular basis: none  Preventative health issues were discussed.   Additional concerns: none  Exercise   bp meds taking faithfully overall numbers good  Blood pressure medicine and blood pressure levels reviewed today with patient. Compliant with blood pressure medicine. States does not miss a dose. No obvious side effects. Blood pressure generally good when checked elsewhere. Watching salt intake.     Review of Systems  Constitutional: Negative for activity change, appetite change and fever.  HENT: Negative for congestion and rhinorrhea.   Eyes: Negative for discharge.  Respiratory: Negative for cough and wheezing.   Cardiovascular: Negative for chest pain.  Gastrointestinal: Negative for abdominal pain, blood in stool and vomiting.  Genitourinary: Negative for difficulty urinating and frequency.  Musculoskeletal: Negative for neck pain.  Skin: Negative for rash.  Allergic/Immunologic: Negative for environmental allergies and food allergies.  Neurological: Negative for weakness and headaches.  Psychiatric/Behavioral: Negative for agitation.  All other systems reviewed and are negative.      Objective:   Physical Exam  Constitutional: He appears well-developed and well-nourished.  HENT:  Head: Normocephalic and atraumatic.  Right Ear: External ear normal.  Left Ear: External ear normal.  Nose: Nose normal.  Mouth/Throat: Oropharynx is clear and moist.  Eyes: Pupils are equal, round, and reactive to light. EOM are normal.  Neck: Normal range of motion. Neck  supple. No thyromegaly present.  Cardiovascular: Normal rate, regular rhythm and normal heart sounds.  No murmur heard. Pulmonary/Chest: Effort normal and breath sounds normal. No respiratory distress. He has no wheezes.  Abdominal: Soft. Bowel sounds are normal. He exhibits no distension and no mass. There is no tenderness.  Genitourinary: Penis normal.  Musculoskeletal: Normal range of motion. He exhibits no edema.  Lymphadenopathy:    He has no cervical adenopathy.  Neurological: He is alert. He exhibits normal muscle tone.  Skin: Skin is warm and dry. No erythema.  Psychiatric: He has a normal mood and affect. His behavior is normal. Judgment normal.          Assessment & Plan:  Impression wellness . Colon due 2021, next due 2021.  Exercise discussed.  Diet discussed.  Vaccines discussed with patient chooses to get elsewhere.  2.  Hypertension good control discussed to maintain meds  Appropriate blood work discussed follow-up in 6 months

## 2018-02-21 ENCOUNTER — Encounter: Payer: Self-pay | Admitting: Family Medicine

## 2018-02-21 LAB — BASIC METABOLIC PANEL
BUN/Creatinine Ratio: 19 (ref 10–24)
BUN: 16 mg/dL (ref 8–27)
CO2: 23 mmol/L (ref 20–29)
Calcium: 9 mg/dL (ref 8.6–10.2)
Chloride: 105 mmol/L (ref 96–106)
Creatinine, Ser: 0.85 mg/dL (ref 0.76–1.27)
GFR calc Af Amer: 109 mL/min/{1.73_m2} (ref >59)
GFR calc non Af Amer: 94 mL/min/{1.73_m2} (ref >59)
Glucose: 110 mg/dL — ABNORMAL HIGH (ref 65–99)
Potassium: 4.1 mmol/L (ref 3.5–5.2)
Sodium: 143 mmol/L (ref 134–144)

## 2018-02-21 LAB — HEPATIC FUNCTION PANEL
ALT: 44 IU/L (ref 0–44)
AST: 34 IU/L (ref 0–40)
Albumin: 4.5 g/dL (ref 3.6–4.8)
Alkaline Phosphatase: 81 IU/L (ref 39–117)
Bilirubin Total: 0.7 mg/dL (ref 0.0–1.2)
Bilirubin, Direct: 0.18 mg/dL (ref 0.00–0.40)
Total Protein: 6.7 g/dL (ref 6.0–8.5)

## 2018-02-21 LAB — LIPID PANEL
Chol/HDL Ratio: 4.4 ratio (ref 0.0–5.0)
Cholesterol, Total: 158 mg/dL (ref 100–199)
HDL: 36 mg/dL — ABNORMAL LOW (ref >39)
LDL Calculated: 98 mg/dL (ref 0–99)
Triglycerides: 122 mg/dL (ref 0–149)
VLDL Cholesterol Cal: 24 mg/dL (ref 5–40)

## 2018-02-21 LAB — PSA: Prostate Specific Ag, Serum: 0.9 ng/mL (ref 0.0–4.0)

## 2018-06-21 ENCOUNTER — Telehealth: Payer: Self-pay | Admitting: Family Medicine

## 2018-06-21 NOTE — Telephone Encounter (Signed)
Patient called in to pay off his bill and also asked for refills on his blood pressure and fluid pills.  I told him he got a 90 day supply with 1 refill that should take him to his March 2020 appt and to check with the pharmacy and call us back if any problems.

## 2018-08-19 ENCOUNTER — Telehealth: Payer: Self-pay | Admitting: Family Medicine

## 2018-08-19 NOTE — Telephone Encounter (Signed)
Please advise 

## 2018-08-19 NOTE — Telephone Encounter (Signed)
We will make it telephone visit tom we will do refills then. plz show this answer to ebony. i'm coming for telehealth tutorial at 1 pm on webex, need to know how to do by tomorrow, thx(and, how to code, charge, etc.)

## 2018-08-19 NOTE — Telephone Encounter (Signed)
Patient transferred up front to set up an appt for tomorrow via tele visit.

## 2018-08-19 NOTE — Telephone Encounter (Signed)
ok 

## 2018-08-19 NOTE — Telephone Encounter (Signed)
Pt called due to getting a reschedule letter, pt wonders if we can send in med refills and schedule for this summer or would the doc prefer a phone visit  Pt was originally scheduled for a 6 month med check on 08/20/2018    Please advise & call pt [phone]

## 2018-08-19 NOTE — Telephone Encounter (Signed)
Ebony aware.

## 2018-08-20 ENCOUNTER — Ambulatory Visit: Payer: BLUE CROSS/BLUE SHIELD | Admitting: Family Medicine

## 2018-08-20 ENCOUNTER — Other Ambulatory Visit: Payer: Self-pay

## 2018-08-20 ENCOUNTER — Ambulatory Visit (INDEPENDENT_AMBULATORY_CARE_PROVIDER_SITE_OTHER): Payer: BLUE CROSS/BLUE SHIELD | Admitting: Family Medicine

## 2018-08-20 DIAGNOSIS — I10 Essential (primary) hypertension: Secondary | ICD-10-CM | POA: Diagnosis not present

## 2018-08-20 MED ORDER — HYDROCHLOROTHIAZIDE 25 MG PO TABS: ORAL_TABLET | ORAL | 1 refills | Status: DC

## 2018-08-20 MED ORDER — LOSARTAN POTASSIUM 100 MG PO TABS: 100.0000 mg | ORAL_TABLET | Freq: Every day | ORAL | 1 refills | Status: DC

## 2018-08-20 NOTE — Progress Notes (Signed)
   Subjective:    Patient ID: Oscar Campbell, male    DOB: Apr 16, 1956, 63 y.o.   MRN: [id number]  Hypertension  This is a chronic problem. The current episode started more than 1 year ago. Risk factors for coronary artery disease include male gender. Treatments tried: cozaar, hctz. There are no compliance problems.    Blood pressure medicine and blood pressure levels reviewed today with patient. Compliant with blood pressure medicine. States does not miss a dose. No obvious side effects. Blood pressure generally good when checked elsewhere. Watching salt intake.  Numb at work good   157 over 68  Exercise so so       Review of Systems No headache, no major weight loss or weight gain, no chest pain no back pain abdominal pain no change in bowel habits complete ROS otherwise negative     Objective:   Physical Exam Alert vitals stable, NAD. Blood pressure good on repeat. HEENT normal. Lungs clear. Heart regular rate and rhythm.        Assessment & Plan:  Impression hypertension.  Good control discussed maintain same meds diet exercise discussed

## 2019-02-03 ENCOUNTER — Telehealth: Payer: Self-pay | Admitting: Family Medicine

## 2019-02-03 DIAGNOSIS — Z125 Encounter for screening for malignant neoplasm of prostate: Secondary | ICD-10-CM

## 2019-02-03 DIAGNOSIS — I10 Essential (primary) hypertension: Secondary | ICD-10-CM

## 2019-02-03 DIAGNOSIS — Z0000 Encounter for general adult medical examination without abnormal findings: Secondary | ICD-10-CM

## 2019-02-03 DIAGNOSIS — Z13220 Encounter for screening for lipoid disorders: Secondary | ICD-10-CM

## 2019-02-03 NOTE — Telephone Encounter (Signed)
Last labs 01/2018: Lipid, Liver, Met 7 and PSA

## 2019-02-03 NOTE — Telephone Encounter (Signed)
Pt has physical scheduled for 02/14/2019 - wonders if he needs to get any lab work  Please advise & call pt

## 2019-02-04 NOTE — Telephone Encounter (Signed)
same

## 2019-02-04 NOTE — Telephone Encounter (Signed)
Left message to return call. bw orders are put in.

## 2019-02-08 NOTE — Telephone Encounter (Signed)
Left message to return call 

## 2019-02-08 NOTE — Telephone Encounter (Signed)
Patient notified

## 2019-02-12 LAB — HEPATIC FUNCTION PANEL
ALT: 38 IU/L (ref 0–44)
AST: 32 IU/L (ref 0–40)
Albumin: 4.2 g/dL (ref 3.8–4.8)
Alkaline Phosphatase: 80 IU/L (ref 39–117)
Bilirubin Total: 0.5 mg/dL (ref 0.0–1.2)
Bilirubin, Direct: 0.13 mg/dL (ref 0.00–0.40)
Total Protein: 6.7 g/dL (ref 6.0–8.5)

## 2019-02-12 LAB — BASIC METABOLIC PANEL WITH GFR
BUN/Creatinine Ratio: 11 (ref 10–24)
BUN: 10 mg/dL (ref 8–27)
CO2: 24 mmol/L (ref 20–29)
Calcium: 9.1 mg/dL (ref 8.6–10.2)
Chloride: 102 mmol/L (ref 96–106)
Creatinine, Ser: 0.93 mg/dL (ref 0.76–1.27)
GFR calc Af Amer: 101 mL/min/1.73
GFR calc non Af Amer: 88 mL/min/1.73
Glucose: 149 mg/dL — ABNORMAL HIGH (ref 65–99)
Potassium: 3.8 mmol/L (ref 3.5–5.2)
Sodium: 139 mmol/L (ref 134–144)

## 2019-02-12 LAB — LIPID PANEL
Chol/HDL Ratio: 4.4 ratio (ref 0.0–5.0)
Cholesterol, Total: 163 mg/dL (ref 100–199)
HDL: 37 mg/dL — ABNORMAL LOW (ref >39)
LDL Chol Calc (NIH): 97 mg/dL (ref 0–99)
Triglycerides: 164 mg/dL — ABNORMAL HIGH (ref 0–149)
VLDL Cholesterol Cal: 29 mg/dL (ref 5–40)

## 2019-02-12 LAB — PSA: Prostate Specific Ag, Serum: 0.9 ng/mL (ref 0.0–4.0)

## 2019-02-14 ENCOUNTER — Encounter: Payer: Self-pay | Admitting: Family Medicine

## 2019-02-14 ENCOUNTER — Other Ambulatory Visit: Payer: Self-pay

## 2019-02-14 ENCOUNTER — Ambulatory Visit (INDEPENDENT_AMBULATORY_CARE_PROVIDER_SITE_OTHER): Payer: BC Managed Care – PPO | Admitting: Family Medicine

## 2019-02-14 VITALS — BP 120/74 | Temp 97.5°F | Ht 69.5 in | Wt 251.0 lb

## 2019-02-14 DIAGNOSIS — Z0000 Encounter for general adult medical examination without abnormal findings: Secondary | ICD-10-CM

## 2019-02-14 DIAGNOSIS — R7301 Impaired fasting glucose: Secondary | ICD-10-CM

## 2019-02-14 DIAGNOSIS — Z23 Encounter for immunization: Secondary | ICD-10-CM

## 2019-02-14 DIAGNOSIS — I10 Essential (primary) hypertension: Secondary | ICD-10-CM

## 2019-02-14 LAB — POCT GLYCOSYLATED HEMOGLOBIN (HGB A1C): Hemoglobin A1C: 5.7 % — AB (ref 4.0–5.6)

## 2019-02-14 MED ORDER — LOSARTAN POTASSIUM 100 MG PO TABS: 100.0000 mg | ORAL_TABLET | Freq: Every day | ORAL | 1 refills | Status: DC

## 2019-02-14 MED ORDER — HYDROCHLOROTHIAZIDE 25 MG PO TABS: ORAL_TABLET | ORAL | 1 refills | Status: DC

## 2019-02-14 NOTE — Progress Notes (Signed)
Subjective:    Patient ID: Oscar Campbell, male    DOB: February 07, 1956, 63 y.o.   MRN: [id number]  HPI The patient comes in today for a wellness visit.    A review of their health history was completed.  A review of medications was also completed.  Any needed refills; update meds  Eating habits: health conscious  Falls/  MVA accidents in past few months: none  Regular exercise: yes  Specialist pt sees on regular basis: none  Preventative health issues were discussed.   Additional concerns: none  Blood pressure medicine and blood pressure levels reviewed today with patient. Compliant with blood pressure medicine. States does not miss a dose. No obvious side effects. Blood pressure generally good when checked elsewhere. Watching salt intake.  Results for orders placed or performed in visit on 02/03/19  Lipid panel  Result Value Ref Range   Cholesterol, Total 163 100 - 199 mg/dL   Triglycerides 164 (H) 0 - 149 mg/dL   HDL 37 (L) >39 mg/dL   VLDL Cholesterol Cal 29 5 - 40 mg/dL   LDL Chol Calc (NIH) 97 0 - 99 mg/dL   Chol/HDL Ratio 4.4 0.0 - 5.0 ratio  Hepatic function panel  Result Value Ref Range   Total Protein 6.7 6.0 - 8.5 g/dL   Albumin 4.2 3.8 - 4.8 g/dL   Bilirubin Total 0.5 0.0 - 1.2 mg/dL   Bilirubin, Direct 0.13 0.00 - 0.40 mg/dL   Alkaline Phosphatase 80 39 - 117 IU/L   AST 32 0 - 40 IU/L   ALT 38 0 - 44 IU/L  Basic metabolic panel  Result Value Ref Range   Glucose 149 (H) 65 - 99 mg/dL   BUN 10 8 - 27 mg/dL   Creatinine, Ser 0.93 0.76 - 1.27 mg/dL   GFR calc non Af Amer 88 >59 mL/min/1.73   GFR calc Af Amer 101 >59 mL/min/1.73   BUN/Creatinine Ratio 11 10 - 24   Sodium 139 134 - 144 mmol/L   Potassium 3.8 3.5 - 5.2 mmol/L   Chloride 102 96 - 106 mmol/L   CO2 24 20 - 29 mmol/L   Calcium 9.1 8.6 - 10.2 mg/dL  PSA  Result Value Ref Range   Prostate Specific Ag, Serum 0.9 0.0 - 4.0 ng/mL   fam hx of diabetes, grandmother had it  Strong hx       Review of Systems  Constitutional: Negative for activity change, appetite change and fever.  HENT: Negative for congestion and rhinorrhea.   Eyes: Negative for discharge.  Respiratory: Negative for cough and wheezing.   Cardiovascular: Negative for chest pain.  Gastrointestinal: Negative for abdominal pain, blood in stool and vomiting.  Genitourinary: Negative for difficulty urinating and frequency.  Musculoskeletal: Negative for neck pain.  Skin: Negative for rash.  Allergic/Immunologic: Negative for environmental allergies and food allergies.  Neurological: Negative for weakness and headaches.  Psychiatric/Behavioral: Negative for agitation.  All other systems reviewed and are negative.      Objective:   Physical Exam Constitutional:      Appearance: He is well-developed.  HENT:     Head: Normocephalic and atraumatic.     Right Ear: External ear normal.     Left Ear: External ear normal.     Nose: Nose normal.  Eyes:     Pupils: Pupils are equal, round, and reactive to light.  Neck:     Musculoskeletal: Normal range of motion and neck supple.  Thyroid: No thyromegaly.  Cardiovascular:     Rate and Rhythm: Normal rate and regular rhythm.     Heart sounds: Normal heart sounds. No murmur.  Pulmonary:     Effort: Pulmonary effort is normal. No respiratory distress.     Breath sounds: Normal breath sounds. No wheezing.  Abdominal:     General: Bowel sounds are normal. There is no distension.     Palpations: Abdomen is soft. There is no mass.     Tenderness: There is no abdominal tenderness.  Genitourinary:    Penis: Normal.   Musculoskeletal: Normal range of motion.  Lymphadenopathy:     Cervical: No cervical adenopathy.  Skin:    General: Skin is warm and dry.     Findings: No erythema.  Neurological:     Mental Status: He is alert.     Motor: No abnormal muscle tone.  Psychiatric:        Behavior: Behavior normal.        Judgment: Judgment normal.   Prostate  within normal limits  Results for orders placed or performed in visit on 02/14/19  POCT HgB A1C  Result Value Ref Range   Hemoglobin A1C 5.7 (A) 4.0 - 5.6 %   HbA1c POC (<> result, manual entry)     HbA1c, POC (prediabetic range)     HbA1c, POC (controlled diabetic range)           Assessment & Plan:  Pressure 1 well adult exam.  Next colonoscopy due in three yrs. Now retired.  Diet discussed.  Exercise discussed.  Vaccines discussed flu shot administered  2.  Prediabetes.  Glucose was in the upper 140s.  A1c today fortunately not diabetic.  Discussed.  Diet discussed weight loss encouraged  3.  Hypertension.  Good control discussed maintain same meds  Follow-up in 6 months diet exercise discussedMedications refilled

## 2019-06-30 ENCOUNTER — Encounter: Payer: Self-pay | Admitting: Family Medicine

## 2019-08-15 ENCOUNTER — Other Ambulatory Visit: Payer: Self-pay

## 2019-08-15 ENCOUNTER — Ambulatory Visit (INDEPENDENT_AMBULATORY_CARE_PROVIDER_SITE_OTHER): Payer: 59 | Admitting: Family Medicine

## 2019-08-15 DIAGNOSIS — R7303 Prediabetes: Secondary | ICD-10-CM | POA: Diagnosis not present

## 2019-08-15 DIAGNOSIS — R7301 Impaired fasting glucose: Secondary | ICD-10-CM

## 2019-08-15 DIAGNOSIS — I10 Essential (primary) hypertension: Secondary | ICD-10-CM | POA: Diagnosis not present

## 2019-08-15 MED ORDER — HYDROCHLOROTHIAZIDE 25 MG PO TABS: ORAL_TABLET | ORAL | 1 refills | Status: DC

## 2019-08-15 MED ORDER — LOSARTAN POTASSIUM 100 MG PO TABS: 100.0000 mg | ORAL_TABLET | Freq: Every day | ORAL | 1 refills | Status: DC

## 2019-08-15 NOTE — Progress Notes (Signed)
   Subjective:  Audio plus video  Patient ID: Oscar Campbell, male    DOB: 06/19/1955, 64 y.o.   MRN: [id number]  Hypertension This is a chronic problem. Risk factors for coronary artery disease include male gender. There are no compliance problems.   Pt states he has not had any issues with blood pressure and does take BP regular. Pt is taking HCTZ 25 mg and Losartan 100 mg daily.   Virtual Visit via Telephone Note  I connected with Oscar Campbell on 08/15/19 at  8:40 AM EDT by telephone and verified that I am speaking with the correct person using two identifiers.  Location: Patient: home Provider: office   I discussed the limitations, risks, security and privacy concerns of performing an evaluation and management service by telephone and the availability of in person appointments. I also discussed with the patient that there may be a patient responsible charge related to this service. The patient expressed understanding and agreed to proceed.   History of Present Illness:    Observations/Objective:   Assessment and Plan:   Follow Up Instructions:    I discussed the assessment and treatment plan with the patient. The patient was provided an opportunity to ask questions and all were answered. The patient agreed with the plan and demonstrated an understanding of the instructions.   The patient was advised to call back or seek an in-person evaluation if the symptoms worsen or if the condition fails to improve as anticipated.  I provided  minutes of non-face-to-face time during this encounter.   Walking reg  Blood pressure medicine and blood pressure levels reviewed today with patient. Compliant with blood pressure medicine. States does not miss a dose. No obvious side effects. Blood pressure generally good when checked elsewhere. Watching salt intake.   Pt now retired, states was reduced stress overall feeling better.  Improved energy level.       Review of Systems No  headache no chest pain no shortness of breath    Objective:   Physical Exam   Virtual.  Patient checked his own blood pressure excellent today 132/76     Assessment & Plan:  Impression 1 essential hypertension.  Good control.  Diet discussed.  Compliance discussed.  Exercise discussed and encouraged  2.  Prediabetes.  Diet encouraged in this regard  Follow-up 6 months for wellness plus chronic plus blood work

## 2019-08-18 ENCOUNTER — Ambulatory Visit: Payer: 59 | Attending: Internal Medicine

## 2019-08-18 DIAGNOSIS — Z23 Encounter for immunization: Secondary | ICD-10-CM

## 2019-08-18 NOTE — Progress Notes (Signed)
   Covid-19 Vaccination Clinic  Name:  Oscar Campbell    MRN: [id number] DOB: 1955/11/27  08/18/2019  Oscar Campbell was observed post Covid-19 immunization for 15 minutes without incident. He was provided with Vaccine Information Sheet and instruction to access the V-Safe system.   Oscar Campbell was instructed to call 911 with any severe reactions post vaccine: Marland Kitchen Difficulty breathing  . Swelling of face and throat  . A fast heartbeat  . A bad rash all over body  . Dizziness and weakness   Immunizations Administered    Name Date Dose VIS Date Route   Moderna COVID-19 Vaccine 08/18/2019  8:42 AM 0.5 mL 04/26/2019 Intramuscular   Manufacturer: Moderna   Lot: [id number]   [id number]

## 2019-09-21 ENCOUNTER — Ambulatory Visit: Payer: 59 | Attending: Internal Medicine

## 2019-09-21 DIAGNOSIS — Z23 Encounter for immunization: Secondary | ICD-10-CM

## 2019-09-21 NOTE — Progress Notes (Signed)
   Covid-19 Vaccination Clinic  Name:  GIOVANY DEHOFF    MRN: [id number] DOB: 29-Aug-1955  09/21/2019  Mr. Heckle was observed post Covid-19 immunization for 15 minutes without incident. He was provided with Vaccine Information Sheet and instruction to access the V-Safe system.   Mr. Debenedetti was instructed to call 911 with any severe reactions post vaccine: Marland Kitchen Difficulty breathing  . Swelling of face and throat  . A fast heartbeat  . A bad rash all over body  . Dizziness and weakness   Immunizations Administered    Name Date Dose VIS Date Route   Moderna COVID-19 Vaccine 09/21/2019  8:47 AM 0.5 mL 04/2019 Intramuscular   Manufacturer: Moderna   Lot: [id number]   [id number]

## 2019-11-01 ENCOUNTER — Other Ambulatory Visit: Payer: Self-pay | Admitting: Family Medicine

## 2019-12-28 ENCOUNTER — Telehealth: Payer: Self-pay | Admitting: Family Medicine

## 2019-12-28 DIAGNOSIS — R7301 Impaired fasting glucose: Secondary | ICD-10-CM

## 2019-12-28 DIAGNOSIS — Z13220 Encounter for screening for lipoid disorders: Secondary | ICD-10-CM

## 2019-12-28 DIAGNOSIS — Z125 Encounter for screening for malignant neoplasm of prostate: Secondary | ICD-10-CM

## 2019-12-28 DIAGNOSIS — Z0000 Encounter for general adult medical examination without abnormal findings: Secondary | ICD-10-CM

## 2019-12-28 DIAGNOSIS — I10 Essential (primary) hypertension: Secondary | ICD-10-CM

## 2019-12-28 NOTE — Telephone Encounter (Signed)
Last labs 01/2019: Lipid, Liver, Met 7, PSA, HgbA1c

## 2019-12-28 NOTE — Telephone Encounter (Signed)
Patient is scheduled for physical in September and would like lab orders put in for [facility]

## 2019-12-30 NOTE — Telephone Encounter (Signed)
Lab orders placed and pt is aware 

## 2019-12-30 NOTE — Telephone Encounter (Signed)
Yes. Pls order those labs. Thx. Dr. Lovena Le

## 2020-01-25 ENCOUNTER — Other Ambulatory Visit: Payer: Self-pay | Admitting: Family Medicine

## 2020-02-03 ENCOUNTER — Telehealth: Payer: Self-pay | Admitting: Urology

## 2020-02-03 ENCOUNTER — Other Ambulatory Visit: Payer: 59

## 2020-02-03 ENCOUNTER — Other Ambulatory Visit: Payer: Self-pay

## 2020-02-03 DIAGNOSIS — N200 Calculus of kidney: Secondary | ICD-10-CM

## 2020-02-03 NOTE — Addendum Note (Signed)
Addended by: Dorisann Frames on: 02/03/2020 12:15 PM   Modules accepted: Orders

## 2020-02-03 NOTE — Telephone Encounter (Signed)
Spoke with pt and said he has poss kidney stone. Spoke with McKenzie and he advised for pt to drop a UA off and see him on 02/06/20.

## 2020-02-06 ENCOUNTER — Other Ambulatory Visit: Payer: Self-pay

## 2020-02-06 ENCOUNTER — Ambulatory Visit (INDEPENDENT_AMBULATORY_CARE_PROVIDER_SITE_OTHER): Payer: 59 | Admitting: Urology

## 2020-02-06 ENCOUNTER — Encounter: Payer: Self-pay | Admitting: Urology

## 2020-02-06 VITALS — BP 136/84 | HR 73 | Temp 98.6°F | Ht 69.5 in | Wt 251.0 lb

## 2020-02-06 DIAGNOSIS — N200 Calculus of kidney: Secondary | ICD-10-CM | POA: Diagnosis not present

## 2020-02-06 DIAGNOSIS — N3000 Acute cystitis without hematuria: Secondary | ICD-10-CM

## 2020-02-06 DIAGNOSIS — R1031 Right lower quadrant pain: Secondary | ICD-10-CM | POA: Diagnosis not present

## 2020-02-06 LAB — URINALYSIS, ROUTINE W REFLEX MICROSCOPIC
Bilirubin, UA: NEGATIVE
Glucose, UA: NEGATIVE
Ketones, UA: NEGATIVE
Nitrite, UA: POSITIVE — AB
Specific Gravity, UA: 1.02 (ref 1.005–1.030)
Urobilinogen, Ur: 0.2 mg/dL (ref 0.2–1.0)
pH, UA: 5.5 (ref 5.0–7.5)

## 2020-02-06 LAB — MICROSCOPIC EXAMINATION
RBC, Urine: NONE SEEN /hpf (ref 0–2)
Renal Epithel, UA: NONE SEEN /hpf
WBC, UA: >30 /hpf — AB (ref 0–5)

## 2020-02-06 MED ORDER — SULFAMETHOXAZOLE-TRIMETHOPRIM 800-160 MG PO TABS
1.0000 | ORAL_TABLET | Freq: Two times a day (BID) | ORAL | 0 refills | Status: DC
Start: 2020-02-06 — End: 2020-02-15

## 2020-02-06 NOTE — Progress Notes (Signed)
Urological Symptom Review  Patient is experiencing the following symptoms: Get up at night to urinate Trouble starting stream Kidney stone   Review of Systems  Gastrointestinal (upper)  : Negative for upper GI symptoms  Gastrointestinal (lower) : Negative for lower GI symptoms  Constitutional : Negative for symptoms  Skin: Negative for skin symptoms  Eyes: Negative for eye symptoms  Ear/Nose/Throat : Negative for Ear/Nose/Throat symptoms  Hematologic/Lymphatic: Negative for Hematologic/Lymphatic symptoms  Cardiovascular : Negative for cardiovascular symptoms  Respiratory : Negative for respiratory symptoms  Endocrine: Negative for endocrine symptoms  Musculoskeletal: Back pain  Neurological: Negative for neurological symptoms  Psychologic: Negative for psychiatric symptoms

## 2020-02-06 NOTE — Progress Notes (Signed)
02/06/2020 9:33 AM   Oscar Campbell 05/14/1956 [id number]  Referring provider: Erven Colla, DO [place],  [place] [address]  Hx of nephrolithiasis  HPI: Oscar Campbell is a 64yo here for evaluation of nephrolithiasis. He was last seen in 2018. 1 week ago he developed right flank pain which then resolved. He then developed a weaker stream, urinary urgency, urinary frequency and nocturia which is new. NO gross hematuria. NO fevers. UA is concerning for infection. He denies dysuria.   His records from [facility] are as follows: I have kidney stones.  HPI: Oscar Campbell is a 64 year-old male established patient who is here for renal calculi.  The problem is on the right side. He first stated noticing pain on approximately 02/23/2013. He is currently having back pain. He denies having flank pain, groin pain, nausea, vomiting, fever, and chills. He has not caught a stone in his urine strainer since his symptoms began.   He has had percutaneous lithotripsy for treatment of his stones in the past.   03/19/2016: Renal US and KUB shows 86mm right lower pole renal calculus. He is currently drinking more water. He has intermittent right flank pain that is sharp, intermittent, nonraditing right flank pain.   02/25/2017: 2 months ago he developed high back pain. The pain then resolved. No LUTS. No recent imaging.     PMH: Past Medical History:  Diagnosis Date  . Complication of anesthesia    woke up during 1st colonscopy  . Elevated PSA   . Erectile dysfunction   . Hypertension   . Impaired fasting glucose   . Kidney stones   . Obesity   . Seasonal rhinitis     Surgical History: Past Surgical History:  Procedure Laterality Date  . CARDIOVASCULAR STRESS TEST    . COLONOSCOPY  2009   Dr. Gala Romney: tubular adenoma  . COLONOSCOPY N/A 03/02/2015   Procedure: COLONOSCOPY;  Surgeon: Daneil Dolin, MD;  Location: [facility];  Service: Endoscopy;  Laterality: N/A;  0830  . CYSTOSCOPY WITH  STENT PLACEMENT Right 05/15/2015   Procedure: CYSTOSCOPY WITH EXTERNALIZED STENT PLACEMENT;  Surgeon: Cleon Gustin, MD;  Location: [facility];  Service: Urology;  Laterality: Right;  . HOLMIUM LASER APPLICATION Right 75/02/2584   Procedure: HOLMIUM LASER APPLICATION;  Surgeon: Cleon Gustin, MD;  Location: [facility];  Service: Urology;  Laterality: Right;  . NEPHROLITHOTOMY Right 05/15/2015   Procedure: NEPHROLITHOTOMY PERCUTANEOUS WITH RIGHT NEPHROSTOMY TUBE PLACEMENT, SURGEON TO OBTAIN ACCESS;  Surgeon: Cleon Gustin, MD;  Location: [facility];  Service: Urology;  Laterality: Right;    Home Medications:  Allergies as of 02/06/2020      Reactions   Penicillins Rash   Has patient had a PCN reaction causing immediate rash, facial/tongue/throat swelling, SOB or lightheadedness with hypotension: unkown Has patient had a PCN reaction causing severe rash involving mucus membranes or skin necrosis: no Has patient had a PCN reaction that required hospitalization no Has patient had a PCN reaction occurring within the last 10 years: no If all of the above answers are "NO", then may proceed with Cephalosporin use.      Medication List       Accurate as of February 06, 2020  9:33 AM. If you have any questions, ask your nurse or doctor.        aspirin EC 81 MG tablet Take 81 mg by mouth daily.   Duexis 800-26.6 MG Tabs Generic drug: Ibuprofen-Famotidine Duexis 800 mg-26.6  mg tablet   Duexis 800-26.6 MG Tabs Generic drug: Ibuprofen-Famotidine   hydrochlorothiazide 25 MG tablet Commonly known as: HYDRODIURIL hydrochlorothiazide 25 mg tablet   hydrochlorothiazide 25 MG tablet Commonly known as: HYDRODIURIL TAKE 1/2 (ONE-HALF) TABLET BY MOUTH ONCE DAILY IN THE MORNING   losartan 100 MG tablet Commonly known as: COZAAR losartan 100 mg tablet   losartan 100 MG tablet Commonly known as: COZAAR Take 1 tablet by mouth once daily       Allergies:  Allergies  Allergen Reactions   . Penicillins Rash    Has patient had a PCN reaction causing immediate rash, facial/tongue/throat swelling, SOB or lightheadedness with hypotension: unkown Has patient had a PCN reaction causing severe rash involving mucus membranes or skin necrosis: no Has patient had a PCN reaction that required hospitalization no Has patient had a PCN reaction occurring within the last 10 years: no If all of the above answers are "NO", then may proceed with Cephalosporin use.    Family History: Family History  Problem Relation Age of Onset  . Heart disease Father   . Colon cancer Neg Hx     Social History:  reports that he has never smoked. He has never used smokeless tobacco. He reports that he does not drink alcohol and does not use drugs.  ROS: All other review of systems were reviewed and are negative except what is noted above in HPI  Physical Exam: BP 136/84   Pulse 73   Temp 98.6 F (37 C)   Ht 5' 9.5" (1.765 m)   Wt 251 lb (113.9 kg)   BMI 36.53 kg/m   Constitutional:  Alert and oriented, No acute distress. HEENT: [place] AT, moist mucus membranes.  Trachea midline, no masses. Cardiovascular: No clubbing, cyanosis, or edema. Respiratory: Normal respiratory effort, no increased work of breathing. GI: Abdomen is soft, nontender, nondistended, no abdominal masses GU: No CVA tenderness.  Lymph: No cervical or inguinal lymphadenopathy. Skin: No rashes, bruises or suspicious lesions. Neurologic: Grossly intact, no focal deficits, moving all 4 extremities. Psychiatric: Normal mood and affect.  Laboratory Data: Lab Results  Component Value Date   WBC 19.9 (H) 05/16/2015   HGB 13.8 05/16/2015   HCT 39.5 05/16/2015   MCV 87.2 05/16/2015   PLT 193 05/16/2015    Lab Results  Component Value Date   CREATININE 0.93 02/11/2019    Lab Results  Component Value Date   PSA 1.03 12/15/2013   PSA 0.91 01/22/2013    No results found for: TESTOSTERONE  Lab Results  Component Value  Date   HGBA1C 5.7 (A) 02/14/2019    Urinalysis    Component Value Date/Time   COLORURINE YELLOW 04/23/2015 0520   APPEARANCEUR CLEAR 04/23/2015 0520   LABSPEC >1.030 (H) 04/23/2015 0520   PHURINE 6.0 04/23/2015 0520   GLUCOSEU NEGATIVE 04/23/2015 0520   HGBUR MODERATE (A) 04/23/2015 0520   BILIRUBINUR NEGATIVE 04/23/2015 0520   BILIRUBINUR + 08/28/2014 1024   KETONESUR NEGATIVE 04/23/2015 0520   PROTEINUR 100 (A) 04/23/2015 0520   NITRITE NEGATIVE 04/23/2015 0520   LEUKOCYTESUR MODERATE (A) 04/23/2015 0520    Lab Results  Component Value Date   BACTERIA MANY (A) 04/23/2015    Pertinent Imaging:  Results for orders placed during the hospital encounter of 03/13/17  DG Abd 1 View  Narrative CLINICAL DATA:  RIGHT SIDE KIDNEY STONE, PAIN IN MIDDLE OF BACK TODAY  EXAM: ABDOMEN - 1 VIEW  COMPARISON:  03/17/2016  FINDINGS: Normal bowel gas pattern.  No definite nephrolithiasis. Stable bilateral pelvic phleboliths. Central prostatic calcifications.  Mild spondylitic change in the lower lumbar spine.  IMPRESSION: 1. Normal bowel gas pattern. 2. No definite urolithiasis.   Electronically Signed By: Lucrezia Europe M.D. On: 03/13/2017 15:57  No results found for this or any previous visit.  No results found for this or any previous visit.  No results found for this or any previous visit.  Results for orders placed during the hospital encounter of 03/27/17  US RENAL  Narrative CLINICAL DATA:  Follow-up renal calculus and left-sided cyst.  EXAM: RENAL / URINARY TRACT ULTRASOUND COMPLETE  COMPARISON:  March 17, 2016  FINDINGS: Right Kidney:  Length: 12.7 cm. There is a 9 mm shadowing calculus in the lower pole of the right kidney, unchanged in the interval with no evidence of hydronephrosis/obstruction.  Left Kidney:  Length: 12.7 cm. There is a 2 x 1.4 x 1.2 cm cyst in the upper pole of the left kidney versus 1.4 x 1.2 x 1.1 cm on the previous  study.  Bladder:  Appears normal for degree of bladder distention.  IMPRESSION: 1. Nonshadowing stone in the lower pole of the right kidney, unchanged. 2. The cyst in the upper pole of the left kidney is a little larger in the interval now measuring up to 2 cm.   Electronically Signed By: Dorise Bullion III M.D On: 03/28/2017 07:15  No results found for this or any previous visit.  No results found for this or any previous visit.  Results for orders placed during the hospital encounter of 04/23/15  CT RENAL STONE STUDY  Narrative CLINICAL DATA:  Chronic right-sided flank pain for 1 year. Pain acutely worsened over the past week. Initial encounter.  EXAM: CT ABDOMEN AND PELVIS WITHOUT CONTRAST  TECHNIQUE: Multidetector CT imaging of the abdomen and pelvis was performed following the standard protocol without IV contrast.  COMPARISON:  None.  FINDINGS: Minimal bibasilar atelectasis is noted.  There is mild fatty infiltration within the liver. The liver and spleen are otherwise unremarkable. The gallbladder is within normal limits. The pancreas and adrenal glands are unremarkable.  There appears to be an obstructing large 1.8 cm stone at the right renal pelvis, with underlying mild hydronephrosis particularly involving the upper pole of the right kidney. Soft tissue inflammation tracks about the proximal right ureter, concerning for underlying ureteritis. No additional obstructing stone is seen distally.  The left kidney is grossly unremarkable. No nonobstructing renal stones are identified. A 1.3 cm cyst is noted at the upper pole of the left kidney.  No free fluid is identified. The small bowel is unremarkable in appearance. The stomach is within normal limits. No acute vascular abnormalities are seen.  The appendix is normal in caliber, without evidence of appendicitis. Scattered diverticulosis is noted along the distal descending and proximal sigmoid  colon, without evidence of diverticulitis.  The bladder is mildly distended and grossly unremarkable. The prostate remains normal in size, with scattered calcification. No inguinal lymphadenopathy is seen.  No acute osseous abnormalities are identified. Vacuum phenomenon is noted at L5-S1.  IMPRESSION: 1. Obstructing large 1.8 cm stone at the right renal pelvis, with underlying mild hydronephrosis particularly involving the upper pole of the right kidney. Soft tissue inflammation tracks about the proximal right ureter, concerning for underlying ureteritis. 2. Small left renal cyst noted. 3. Mild fatty infiltration within the liver. 4. Scattered diverticulosis along the distal descending and proximal sigmoid colon, without evidence of diverticulitis.   Electronically  Signed By: Garald Balding M.D. On: 04/23/2015 03:46   Assessment & Plan:    1. Kidney stones -Ct stone study - Urinalysis, Routine w reflex microscopic RTC 6 months with KUB  2. Acute cystitis -Urine for culture, will call with results -Bactrim DS BID for 7 days   No follow-ups on file.  Nicolette Bang, MD  [facility] [place]

## 2020-02-06 NOTE — Patient Instructions (Signed)

## 2020-02-07 ENCOUNTER — Ambulatory Visit (HOSPITAL_COMMUNITY)
Admission: RE | Admit: 2020-02-07 | Discharge: 2020-02-07 | Disposition: A | Discharge status: Home / Self Care | Payer: 59 | Source: Ambulatory Visit | Attending: Urology | Admitting: Urology

## 2020-02-07 DIAGNOSIS — R1031 Right lower quadrant pain: Secondary | ICD-10-CM | POA: Diagnosis not present

## 2020-02-09 LAB — URINE CULTURE

## 2020-02-10 ENCOUNTER — Telehealth: Payer: Self-pay

## 2020-02-10 NOTE — Telephone Encounter (Signed)
Left message to continue antibiotic that was prescribed and to call back if he had any questions.

## 2020-02-10 NOTE — Telephone Encounter (Signed)
-----   Message from Irine Seal, MD sent at 02/10/2020 12:42 PM EDT ----- The sulfa he was given on 9/13 should be adequate.

## 2020-02-11 LAB — HEPATIC FUNCTION PANEL
ALT: 31 IU/L (ref 0–44)
AST: 28 IU/L (ref 0–40)
Albumin: 4.1 g/dL (ref 3.8–4.8)
Alkaline Phosphatase: 97 IU/L (ref 44–121)
Bilirubin Total: 0.3 mg/dL (ref 0.0–1.2)
Bilirubin, Direct: 0.12 mg/dL (ref 0.00–0.40)
Total Protein: 6.9 g/dL (ref 6.0–8.5)

## 2020-02-11 LAB — LIPID PANEL
Chol/HDL Ratio: 4.3 ratio (ref 0.0–5.0)
Cholesterol, Total: 143 mg/dL (ref 100–199)
HDL: 33 mg/dL — ABNORMAL LOW (ref >39)
LDL Chol Calc (NIH): 76 mg/dL (ref 0–99)
Triglycerides: 199 mg/dL — ABNORMAL HIGH (ref 0–149)
VLDL Cholesterol Cal: 34 mg/dL (ref 5–40)

## 2020-02-11 LAB — BASIC METABOLIC PANEL
BUN/Creatinine Ratio: 14 (ref 10–24)
BUN: 14 mg/dL (ref 8–27)
CO2: 24 mmol/L (ref 20–29)
Calcium: 9 mg/dL (ref 8.6–10.2)
Chloride: 102 mmol/L (ref 96–106)
Creatinine, Ser: 1.03 mg/dL (ref 0.76–1.27)
GFR calc Af Amer: 89 mL/min/{1.73_m2} (ref >59)
GFR calc non Af Amer: 77 mL/min/{1.73_m2} (ref >59)
Glucose: 154 mg/dL — ABNORMAL HIGH (ref 65–99)
Potassium: 4 mmol/L (ref 3.5–5.2)
Sodium: 138 mmol/L (ref 134–144)

## 2020-02-11 LAB — PSA: Prostate Specific Ag, Serum: 4.6 ng/mL — ABNORMAL HIGH (ref 0.0–4.0)

## 2020-02-11 LAB — HEMOGLOBIN A1C
Est. average glucose Bld gHb Est-mCnc: 126 mg/dL
Hgb A1c MFr Bld: 6 % — ABNORMAL HIGH (ref 4.8–5.6)

## 2020-02-14 ENCOUNTER — Other Ambulatory Visit: Payer: Self-pay

## 2020-02-14 DIAGNOSIS — R972 Elevated prostate specific antigen [PSA]: Secondary | ICD-10-CM

## 2020-02-15 ENCOUNTER — Other Ambulatory Visit: Payer: Self-pay

## 2020-02-15 ENCOUNTER — Ambulatory Visit (INDEPENDENT_AMBULATORY_CARE_PROVIDER_SITE_OTHER): Payer: 59 | Admitting: Family Medicine

## 2020-02-15 ENCOUNTER — Encounter: Payer: Self-pay | Admitting: Family Medicine

## 2020-02-15 VITALS — BP 136/76 | HR 90 | Temp 97.5°F | Ht 69.5 in | Wt 245.4 lb

## 2020-02-15 DIAGNOSIS — I10 Essential (primary) hypertension: Secondary | ICD-10-CM

## 2020-02-15 DIAGNOSIS — Z0000 Encounter for general adult medical examination without abnormal findings: Secondary | ICD-10-CM | POA: Diagnosis not present

## 2020-02-15 DIAGNOSIS — R7301 Impaired fasting glucose: Secondary | ICD-10-CM | POA: Diagnosis not present

## 2020-02-15 DIAGNOSIS — Z23 Encounter for immunization: Secondary | ICD-10-CM | POA: Diagnosis not present

## 2020-02-15 DIAGNOSIS — Z87440 Personal history of urinary (tract) infections: Secondary | ICD-10-CM

## 2020-02-15 MED ORDER — LOSARTAN POTASSIUM 100 MG PO TABS: 100.0000 mg | ORAL_TABLET | Freq: Every day | ORAL | 1 refills | Status: DC

## 2020-02-15 MED ORDER — HYDROCHLOROTHIAZIDE 25 MG PO TABS: ORAL_TABLET | ORAL | 1 refills | Status: DC

## 2020-02-15 NOTE — Progress Notes (Signed)
Patient ID: Oscar Campbell, male    DOB: 07-15-1955, 64 y.o.   MRN: [id number]   Chief Complaint  Patient presents with  . Annual Exam   Subjective:    HPI Pt seen for wellness exam.  Last week had UTI and repeating labs in 3 months with Urology. No enlarged prostate and was checked with urology per pt. Treated with antibiotics and on bactrim 2x per day for 7 days. Symptoms he was having is h/o kidney stone in rt side.  Temp 100.45F. urination and not fully emptying. 5 days ago had this and went to office. Culture grew out citrobacter.  Last week had to push and pull from the tractor and feeling had to urinate more frequency.  Pt feeling "like a blockage at end of penis."  Had colonoscopy.  Pt wanting to get his flu vaccine.  Labs showing- Glucose 154,  Trig- 199 PSA elevated at 4.6   The patient comes in today for a wellness visit.   A review of their health history was completed.  A review of medications was also completed.  Any needed refills; Lisinopril  Eating habits: eating well  Falls/  MVA accidents in past few months: none  Regular exercise: try to   Specialist pt sees on regular basis:   Preventative health issues were discussed.   Additional concerns: none  Medical History Rhiley has a past medical history of Complication of anesthesia, Elevated PSA, Erectile dysfunction, Hypertension, Impaired fasting glucose, Kidney stones, Obesity, and Seasonal rhinitis.   Outpatient Encounter Medications as of 02/15/2020  Medication Sig  . aspirin EC 81 MG tablet Take 81 mg by mouth daily.    . hydrochlorothiazide (HYDRODIURIL) 25 MG tablet TAKE 1/2 (ONE-HALF) TABLET BY MOUTH ONCE DAILY IN THE MORNING  . Ibuprofen-Famotidine (DUEXIS) 800-26.6 MG TABS Duexis 800 mg-26.6 mg tablet  . losartan (COZAAR) 100 MG tablet Take 1 tablet (100 mg total) by mouth daily.  . [DISCONTINUED] DUEXIS 800-26.6 MG TABS   . [DISCONTINUED] hydrochlorothiazide (HYDRODIURIL) 25 MG  tablet TAKE 1/2 (ONE-HALF) TABLET BY MOUTH ONCE DAILY IN THE MORNING  . [DISCONTINUED] hydrochlorothiazide (HYDRODIURIL) 25 MG tablet hydrochlorothiazide 25 mg tablet  . [DISCONTINUED] losartan (COZAAR) 100 MG tablet Take 1 tablet by mouth once daily  . [DISCONTINUED] losartan (COZAAR) 100 MG tablet losartan 100 mg tablet  . [DISCONTINUED] sulfamethoxazole-trimethoprim (BACTRIM DS) 800-160 MG tablet Take 1 tablet by mouth every 12 (twelve) hours.   No facility-administered encounter medications on file as of 02/15/2020.     Review of Systems  Constitutional: Negative for chills and fever.  HENT: Negative for congestion, rhinorrhea and sore throat.   Respiratory: Negative for cough, shortness of breath and wheezing.   Cardiovascular: Negative for chest pain and leg swelling.  Gastrointestinal: Negative for abdominal pain, diarrhea, nausea and vomiting.  Genitourinary: Positive for difficulty urinating, dysuria and penile pain. Negative for decreased urine volume, flank pain, frequency, scrotal swelling, testicular pain and urgency.  Musculoskeletal: Negative for back pain.  Skin: Negative for rash.  Neurological: Negative for dizziness, weakness and headaches.     Vitals BP 136/76   Pulse 90   Temp (!) 97.5 F (36.4 C)   Ht 5' 9.5" (1.765 m)   Wt 245 lb 6.4 oz (111.3 kg)   SpO2 99%   BMI 35.72 kg/m   Objective:   Physical Exam Vitals and nursing note reviewed.  Constitutional:      General: He is not in acute distress.  Appearance: Normal appearance. He is not ill-appearing.  HENT:     Head: Normocephalic.     Nose: Nose normal. No congestion.     Mouth/Throat:     Mouth: Mucous membranes are moist.     Pharynx: No oropharyngeal exudate.  Eyes:     Extraocular Movements: Extraocular movements intact.     Conjunctiva/sclera: Conjunctivae normal.     Pupils: Pupils are equal, round, and reactive to light.  Cardiovascular:     Rate and Rhythm: Normal rate and regular  rhythm.     Pulses: Normal pulses.     Heart sounds: Normal heart sounds. No murmur heard.   Pulmonary:     Effort: Pulmonary effort is normal.     Breath sounds: Normal breath sounds. No wheezing, rhonchi or rales.  Abdominal:     General: Abdomen is flat. Bowel sounds are normal. There is no distension.     Palpations: Abdomen is soft. There is no mass.     Tenderness: There is no abdominal tenderness. There is no guarding or rebound.     Hernia: No hernia is present.  Musculoskeletal:        General: Normal range of motion.     Right lower leg: No edema.     Left lower leg: No edema.  Skin:    General: Skin is warm and dry.     Findings: No rash.  Neurological:     General: No focal deficit present.     Mental Status: He is alert and oriented to person, place, and time.     Cranial Nerves: No cranial nerve deficit.  Psychiatric:        Mood and Affect: Mood normal.        Behavior: Behavior normal.        Thought Content: Thought content normal.        Judgment: Judgment normal.      Assessment and Plan   1. Encounter for well adult exam without abnormal findings  2. History of UTI  3. Need for vaccination - Flu Vaccine QUAD 6+ mos PF IM (Fluarix Quad PF)  4. Impaired fasting glucose  5. Essential hypertension - losartan (COZAAR) 100 MG tablet; Take 1 tablet (100 mg total) by mouth daily.  Dispense: 90 tablet; Refill: 1 - hydrochlorothiazide (HYDRODIURIL) 25 MG tablet; TAKE 1/2 (ONE-HALF) TABLET BY MOUTH ONCE DAILY IN THE MORNING  Dispense: 45 tablet; Refill: 1   Glucose- elevated at 154, advising to dec carb intake and increase in exercising. a1c at 6.0 per lab, last visit in in office was 5.7.    psa- elevated,just had prostatitis vs. UTI- was treated with abx by Urologist.  Advising to f/u with urology about elevated psa and have it rechecked.  htn- suboptimal, cont to dec salt in diet and cont meds. Will recheck next visit.  H/o uti- finished bactrim for 7  days.   F/u 76mo or prn.

## 2020-02-23 ENCOUNTER — Other Ambulatory Visit: Payer: 59

## 2020-02-23 DIAGNOSIS — Z20822 Contact with and (suspected) exposure to covid-19: Secondary | ICD-10-CM

## 2020-02-24 LAB — NOVEL CORONAVIRUS, NAA: SARS-CoV-2, NAA: NOT DETECTED

## 2020-02-24 LAB — SARS-COV-2, NAA 2 DAY TAT

## 2020-02-26 ENCOUNTER — Encounter: Payer: Self-pay | Admitting: Family Medicine

## 2020-02-29 ENCOUNTER — Other Ambulatory Visit: Payer: Self-pay | Admitting: Family Medicine

## 2020-02-29 DIAGNOSIS — I10 Essential (primary) hypertension: Secondary | ICD-10-CM

## 2020-03-21 ENCOUNTER — Encounter: Payer: Self-pay | Admitting: Internal Medicine

## 2020-04-23 ENCOUNTER — Other Ambulatory Visit: Payer: Self-pay | Admitting: *Deleted

## 2020-04-23 DIAGNOSIS — I10 Essential (primary) hypertension: Secondary | ICD-10-CM

## 2020-04-23 MED ORDER — HYDROCHLOROTHIAZIDE 25 MG PO TABS: ORAL_TABLET | ORAL | 0 refills | Status: DC

## 2020-05-14 ENCOUNTER — Other Ambulatory Visit: Payer: Self-pay

## 2020-05-14 ENCOUNTER — Other Ambulatory Visit: Payer: 59

## 2020-05-14 DIAGNOSIS — R972 Elevated prostate specific antigen [PSA]: Secondary | ICD-10-CM

## 2020-05-15 LAB — PSA: Prostate Specific Ag, Serum: 1.6 ng/mL (ref 0.0–4.0)

## 2020-05-23 NOTE — Progress Notes (Signed)
Sent via mychart

## 2020-06-01 ENCOUNTER — Telehealth: Payer: Self-pay

## 2020-06-01 NOTE — Telephone Encounter (Signed)
FYI only,   Patient called upset because [facility] told him he needed to contact his pcp about his Losartan refills.  I told the patient we called in #[phone] with 4 refills in October and that he should still have refills.  I called [facility] and they looked and they still have refills for him but they are out of stock of the 50 mg and 100mg .  She said they may get some in later today.  I called the patient back and told him he still had refills and that [facility] said to check back after 3:00 today to see if it came in.  Patient said he will do that and if they still don't have any he will check around at other pharmacies to see if they have it and then will call back Monday if he needs [place] to change rx.  He is not out yet and is ok to check around this weekend and let us know. Patient satified.

## 2020-07-10 ENCOUNTER — Other Ambulatory Visit: Payer: Self-pay | Admitting: Family Medicine

## 2020-07-10 DIAGNOSIS — I10 Essential (primary) hypertension: Secondary | ICD-10-CM

## 2020-08-09 ENCOUNTER — Ambulatory Visit: Payer: 59 | Admitting: Urology

## 2020-08-15 ENCOUNTER — Telehealth: Payer: Self-pay | Admitting: Family Medicine

## 2020-08-15 ENCOUNTER — Telehealth (INDEPENDENT_AMBULATORY_CARE_PROVIDER_SITE_OTHER): Payer: 59 | Admitting: Family Medicine

## 2020-08-15 ENCOUNTER — Encounter: Payer: Self-pay | Admitting: Family Medicine

## 2020-08-15 DIAGNOSIS — I10 Essential (primary) hypertension: Secondary | ICD-10-CM | POA: Diagnosis not present

## 2020-08-15 MED ORDER — LOSARTAN POTASSIUM 100 MG PO TABS: 100.0000 mg | ORAL_TABLET | Freq: Every day | ORAL | 1 refills | Status: DC

## 2020-08-15 MED ORDER — HYDROCHLOROTHIAZIDE 25 MG PO TABS: ORAL_TABLET | ORAL | 0 refills | Status: DC

## 2020-08-15 NOTE — Progress Notes (Signed)
Patient ID: Oscar Campbell, male    DOB: 1955-10-13, 65 y.o.   MRN: [id number]   Virtual Visit via Telephone Note  I connected with Oscar Campbell on 08/15/20 at  1:30 PM EDT by telephone and verified that I am speaking with the correct person using two identifiers.  Location: Patient: home Provider: office   I discussed the limitations, risks, security and privacy concerns of performing an evaluation and management service by telephone and the availability of in person appointments. I also discussed with the patient that there may be a patient responsible charge related to this service. The patient expressed understanding and agreed to proceed.   Chief Complaint  Patient presents with  . Hypertension   Subjective:    HPI Pt here for HTN follow up.   Pt states he checks his blood pressure once a week. No issues. Taking HCTZ and Losartan daily. Pt states he needs refills on HCTZ but has enough Losartan. Pt was out of his HCTZ due to [facility] being out of it; was able to pick it up almost one week later.   Pt stating doing well with bp.  bp at home- 136-138/70. No ha, chest pain, sob, leg swelling.  Has elevated psa at 4.6, in 9/21 and on recheck 1.6 in 12/21.  Seeing urology due to retained stone. No infections, but they are watching.  Upcoming appt with them soon.   Medical History Oscar Campbell has a past medical history of Complication of anesthesia, Elevated PSA, Erectile dysfunction, Hypertension, Impaired fasting glucose, Kidney stones, Obesity, and Seasonal rhinitis.   Outpatient Encounter Medications as of 08/15/2020  Medication Sig  . aspirin EC 81 MG tablet Take 81 mg by mouth daily.  . [DISCONTINUED] hydrochlorothiazide (HYDRODIURIL) 25 MG tablet TAKE 1/2 (ONE-HALF) TABLET BY MOUTH ONCE DAILY IN THE MORNING  . [DISCONTINUED] losartan (COZAAR) 100 MG tablet Take 1 tablet by mouth once daily  . hydrochlorothiazide (HYDRODIURIL) 25 MG tablet TAKE 1/2 (ONE-HALF) TABLET BY  MOUTH ONCE DAILY IN THE MORNING  . losartan (COZAAR) 100 MG tablet Take 1 tablet (100 mg total) by mouth daily.  . [DISCONTINUED] Ibuprofen-Famotidine (DUEXIS) 800-26.6 MG TABS Duexis 800 mg-26.6 mg tablet   No facility-administered encounter medications on file as of 08/15/2020.     Review of Systems  Constitutional: Negative for chills and fever.  HENT: Negative for congestion, rhinorrhea and sore throat.   Respiratory: Negative for cough, shortness of breath and wheezing.   Cardiovascular: Negative for chest pain, palpitations and leg swelling.  Gastrointestinal: Negative for abdominal pain, diarrhea, nausea and vomiting.  Genitourinary: Negative for dysuria and frequency.  Skin: Negative for rash.  Neurological: Negative for dizziness, weakness and headaches.     Vitals There were no vitals taken for this visit.  Objective:   Physical Exam No PE due to phone visit.  Assessment and Plan   1. Essential hypertension - hydrochlorothiazide (HYDRODIURIL) 25 MG tablet; TAKE 1/2 (ONE-HALF) TABLET BY MOUTH ONCE DAILY IN THE MORNING  Dispense: 45 tablet; Refill: 0 - losartan (COZAAR) 100 MG tablet; Take 1 tablet (100 mg total) by mouth daily.  Dispense: 90 tablet; Refill: 1   Pt to f/u with urology for kidney stone and elevated psa from 9/21 on recheck was normal.  htn- stable. Cont meds.  Dec salt in diet.    Return in about 6 months (around 02/15/2021) for f/u htn.   08/15/2020  Follow Up Instructions:    I discussed the assessment and treatment  plan with the patient. The patient was provided an opportunity to ask questions and all were answered. The patient agreed with the plan and demonstrated an understanding of the instructions.   The patient was advised to call back or seek an in-person evaluation if the symptoms worsen or if the condition fails to improve as anticipated.  I provided 11 minutes of non-face-to-face time during this encounter.

## 2020-08-15 NOTE — Telephone Encounter (Signed)
[name], obey are scheduled for a virtual visit with your provider today.    Just as we do with appointments in the office, we must obtain your consent to participate.  Your consent will be active for this visit and any virtual visit you may have with one of our providers in the next 365 days.    If you have a MyChart account, I can also send a copy of this consent to you electronically.  All virtual visits are billed to your insurance company just like a traditional visit in the office.  As this is a virtual visit, video technology does not allow for your provider to perform a traditional examination.  This may limit your provider's ability to fully assess your condition.  If your provider identifies any concerns that need to be evaluated in person or the need to arrange testing such as labs, EKG, etc, we will make arrangements to do so.    Although advances in technology are sophisticated, we cannot ensure that it will always work on either your end or our end.  If the connection with a video visit is poor, we may have to switch to a telephone visit.  With either a video or telephone visit, we are not always able to ensure that we have a secure connection.   I need to obtain your verbal consent now.   Are you willing to proceed with your visit today?   Oscar Campbell has provided verbal consent on 08/15/2020 for a virtual visit (video or telephone).   Vicente Males, LPN [id number]/44/9675  91:63 AM

## 2020-08-22 ENCOUNTER — Other Ambulatory Visit: Payer: Self-pay

## 2020-08-22 ENCOUNTER — Encounter: Payer: Self-pay | Admitting: Urology

## 2020-08-22 ENCOUNTER — Ambulatory Visit (INDEPENDENT_AMBULATORY_CARE_PROVIDER_SITE_OTHER): Payer: 59 | Admitting: Urology

## 2020-08-22 ENCOUNTER — Ambulatory Visit (HOSPITAL_COMMUNITY)
Admission: RE | Admit: 2020-08-22 | Discharge: 2020-08-22 | Disposition: A | Discharge status: Home / Self Care | Payer: 59 | Source: Ambulatory Visit | Attending: Urology | Admitting: Urology

## 2020-08-22 VITALS — BP 147/86 | HR 78 | Temp 98.0°F | Resp 16 | Ht 71.0 in | Wt 242.0 lb

## 2020-08-22 DIAGNOSIS — R972 Elevated prostate specific antigen [PSA]: Secondary | ICD-10-CM

## 2020-08-22 DIAGNOSIS — N200 Calculus of kidney: Secondary | ICD-10-CM

## 2020-08-22 LAB — URINALYSIS, ROUTINE W REFLEX MICROSCOPIC
Bilirubin, UA: NEGATIVE
Glucose, UA: NEGATIVE
Ketones, UA: NEGATIVE
Nitrite, UA: POSITIVE — AB
Protein,UA: NEGATIVE
Specific Gravity, UA: 1.02 (ref 1.005–1.030)
Urobilinogen, Ur: 0.2 mg/dL (ref 0.2–1.0)
pH, UA: 5.5 (ref 5.0–7.5)

## 2020-08-22 LAB — MICROSCOPIC EXAMINATION: Renal Epithel, UA: NONE SEEN /hpf

## 2020-08-22 NOTE — Progress Notes (Signed)
08/22/2020 10:17 AM   Oscar Campbell 16-May-1956 [id number]  Referring provider: Erven Colla, DO [address] [place],  [place] [address]  followup elevated PSA and nephrolithiasis  HPI: Oscar Campbell is a 65yo here for followup for nephrolithiasis and Elevated PSA. PSA decreased to 1.6 from 4.6. IPSS 1 QOL 0. No stone events since last visit. No flank pain. KUB this morning shows no calculi   PMH: Past Medical History:  Diagnosis Date  . Complication of anesthesia    woke up during 1st colonscopy  . Elevated PSA   . Erectile dysfunction   . Hypertension   . Impaired fasting glucose   . Kidney stones   . Obesity   . Seasonal rhinitis     Surgical History: Past Surgical History:  Procedure Laterality Date  . CARDIOVASCULAR STRESS TEST    . COLONOSCOPY  2009   Dr. Gala Romney: tubular adenoma  . COLONOSCOPY N/A 03/02/2015   Procedure: COLONOSCOPY;  Surgeon: Daneil Dolin, MD;  Location: [facility];  Service: Endoscopy;  Laterality: N/A;  0830  . CYSTOSCOPY WITH STENT PLACEMENT Right 05/15/2015   Procedure: CYSTOSCOPY WITH EXTERNALIZED STENT PLACEMENT;  Surgeon: Cleon Gustin, MD;  Location: [facility];  Service: Urology;  Laterality: Right;  . HOLMIUM LASER APPLICATION Right 27/78/2423   Procedure: HOLMIUM LASER APPLICATION;  Surgeon: Cleon Gustin, MD;  Location: [facility];  Service: Urology;  Laterality: Right;  . NEPHROLITHOTOMY Right 05/15/2015   Procedure: NEPHROLITHOTOMY PERCUTANEOUS WITH RIGHT NEPHROSTOMY TUBE PLACEMENT, SURGEON TO OBTAIN ACCESS;  Surgeon: Cleon Gustin, MD;  Location: [facility];  Service: Urology;  Laterality: Right;    Home Medications:  Allergies as of 08/22/2020      Reactions   Penicillins Rash   Has patient had a PCN reaction causing immediate rash, facial/tongue/throat swelling, SOB or lightheadedness with hypotension: unkown Has patient had a PCN reaction causing severe rash involving mucus membranes or skin necrosis: no Has patient had a  PCN reaction that required hospitalization no Has patient had a PCN reaction occurring within the last 10 years: no If all of the above answers are "NO", then may proceed with Cephalosporin use.      Medication List       Accurate as of August 22, 2020 10:17 AM. If you have any questions, ask your nurse or doctor.        aspirin EC 81 MG tablet Take 81 mg by mouth daily.   hydrochlorothiazide 25 MG tablet Commonly known as: HYDRODIURIL TAKE 1/2 (ONE-HALF) TABLET BY MOUTH ONCE DAILY IN THE MORNING   losartan 100 MG tablet Commonly known as: COZAAR Take 1 tablet (100 mg total) by mouth daily.       Allergies:  Allergies  Allergen Reactions  . Penicillins Rash    Has patient had a PCN reaction causing immediate rash, facial/tongue/throat swelling, SOB or lightheadedness with hypotension: unkown Has patient had a PCN reaction causing severe rash involving mucus membranes or skin necrosis: no Has patient had a PCN reaction that required hospitalization no Has patient had a PCN reaction occurring within the last 10 years: no If all of the above answers are "NO", then may proceed with Cephalosporin use.    Family History: Family History  Problem Relation Age of Onset  . Heart disease Father   . Colon cancer Neg Hx     Social History:  reports that he has never smoked. He has never used smokeless tobacco. He reports that he  does not drink alcohol and does not use drugs.  ROS: All other review of systems were reviewed and are negative except what is noted above in HPI  Physical Exam: BP (!) 147/86   Pulse 78   Temp 98 F (36.7 C)   Resp 16   Ht 5\' 11"  (1.803 m)   Wt 242 lb (109.8 kg)   BMI 33.75 kg/m   Constitutional:  Alert and oriented, No acute distress. HEENT: [facility] AT, moist mucus membranes.  Trachea midline, no masses. Cardiovascular: No clubbing, cyanosis, or edema. Respiratory: Normal respiratory effort, no increased work of breathing. GI: Abdomen is soft,  nontender, nondistended, no abdominal masses GU: No CVA tenderness.  Lymph: No cervical or inguinal lymphadenopathy. Skin: No rashes, bruises or suspicious lesions. Neurologic: Grossly intact, no focal deficits, moving all 4 extremities. Psychiatric: Normal mood and affect.  Laboratory Data: Lab Results  Component Value Date   WBC 19.9 (H) 05/16/2015   HGB 13.8 05/16/2015   HCT 39.5 05/16/2015   MCV 87.2 05/16/2015   PLT 193 05/16/2015    Lab Results  Component Value Date   CREATININE 1.03 02/10/2020    Lab Results  Component Value Date   PSA 1.03 12/15/2013   PSA 0.91 01/22/2013    No results found for: TESTOSTERONE  Lab Results  Component Value Date   HGBA1C 6.0 (H) 02/10/2020    Urinalysis    Component Value Date/Time   COLORURINE YELLOW 04/23/2015 0520   APPEARANCEUR Hazy (A) 02/06/2020 0919   LABSPEC >1.030 (H) 04/23/2015 0520   PHURINE 6.0 04/23/2015 0520   GLUCOSEU Negative 02/06/2020 0919   HGBUR MODERATE (A) 04/23/2015 0520   BILIRUBINUR Negative 02/06/2020 0919   KETONESUR NEGATIVE 04/23/2015 0520   PROTEINUR 2+ (A) 02/06/2020 0919   PROTEINUR 100 (A) 04/23/2015 0520   NITRITE Positive (A) 02/06/2020 0919   NITRITE NEGATIVE 04/23/2015 0520   LEUKOCYTESUR 1+ (A) 02/06/2020 0919    Lab Results  Component Value Date   LABMICR See below: 02/06/2020   WBCUA >30 (A) 02/06/2020   LABEPIT 0-10 02/06/2020   BACTERIA Moderate (A) 02/06/2020    Pertinent Imaging: KUB today: Images reviewed and discussed with the patient  Results for orders placed during the hospital encounter of 03/13/17  DG Abd 1 View  Narrative CLINICAL DATA:  RIGHT SIDE KIDNEY STONE, PAIN IN MIDDLE OF BACK TODAY  EXAM: ABDOMEN - 1 VIEW  COMPARISON:  03/17/2016  FINDINGS: Normal bowel gas pattern.  No definite nephrolithiasis. Stable bilateral pelvic phleboliths. Central prostatic calcifications.  Mild spondylitic change in the lower lumbar spine.  IMPRESSION: 1.  Normal bowel gas pattern. 2. No definite urolithiasis.   Electronically Signed By: Lucrezia Europe M.D. On: 03/13/2017 15:57  No results found for this or any previous visit.  No results found for this or any previous visit.  No results found for this or any previous visit.  Results for orders placed during the hospital encounter of 03/27/17  US RENAL  Narrative CLINICAL DATA:  Follow-up renal calculus and left-sided cyst.  EXAM: RENAL / URINARY TRACT ULTRASOUND COMPLETE  COMPARISON:  March 17, 2016  FINDINGS: Right Kidney:  Length: 12.7 cm. There is a 9 mm shadowing calculus in the lower pole of the right kidney, unchanged in the interval with no evidence of hydronephrosis/obstruction.  Left Kidney:  Length: 12.7 cm. There is a 2 x 1.4 x 1.2 cm cyst in the upper pole of the left kidney versus 1.4 x 1.2 x 1.1  cm on the previous study.  Bladder:  Appears normal for degree of bladder distention.  IMPRESSION: 1. Nonshadowing stone in the lower pole of the right kidney, unchanged. 2. The cyst in the upper pole of the left kidney is a little larger in the interval now measuring up to 2 cm.   Electronically Signed By: Dorise Bullion III M.D On: 03/28/2017 07:15  No results found for this or any previous visit.  No results found for this or any previous visit.  Results for orders placed in visit on 02/06/20  CT RENAL STONE STUDY  Narrative CLINICAL DATA:  Bilateral flank pain for 1 week.  EXAM: CT ABDOMEN AND PELVIS WITHOUT CONTRAST  TECHNIQUE: Multidetector CT imaging of the abdomen and pelvis was performed following the standard protocol without IV contrast.  COMPARISON:  04/23/2015  FINDINGS: Lower chest: No acute pulmonary findings. No pleural or pericardial effusion. 3.5 mm nodule in the right middle lobe is unchanged since 2016 and considered benign.  Hepatobiliary: No hepatic lesions are identified without contrast. No intrahepatic biliary  dilatation. The gallbladder appears normal. No common bile duct dilatation.  Pancreas: No mass, inflammation or ductal dilatation.  Spleen: The spleen is upper limits of normal in size. No splenic lesions.  Adrenals/Urinary Tract: Small left adrenal gland nodule is stable and consistent with a benign adenoma. The right adrenal gland is unremarkable.  Small lower pole right renal calculi are noted. No obstructing ureteral calculi or hydroureteronephrosis.  No left-sided renal or ureteral calculi. There is a small left renal cyst noted.  The bladder is unremarkable. No bladder calculi or obvious mass without contrast.  Stomach/Bowel: Stomach, duodenum, small bowel and colon are unremarkable. No acute inflammatory changes, mass lesions or obstructive findings. The terminal ileum is normal. The appendix is normal.  Vascular/Lymphatic: The aorta is normal in caliber. No atheroscerlotic calcifications. No mesenteric of retroperitoneal mass or adenopathy. Small scattered lymph nodes are noted.  Reproductive: The prostate gland and seminal vesicles are unremarkable.  Other: No pelvic mass or adenopathy. No free pelvic fluid collections. No inguinal mass or adenopathy. No abdominal wall hernia or subcutaneous lesions.  Musculoskeletal: No significant bony findings.  IMPRESSION: 1. Small lower pole right renal calculi but no obstructing ureteral calculi or hydroureteronephrosis. 2. No acute abdominal/pelvic findings, mass lesions or adenopathy. 3. Stable small left adrenal gland adenoma.   Electronically Signed By: Marijo Sanes M.D. On: 02/07/2020 10:43   Assessment & Plan:    1. Kidney stones -RTC 1 year with KUB - Urinalysis, Routine w reflex microscopic  2. Elevated PSA RTC 1 year with PSA   No follow-ups on file.  Nicolette Bang, MD  [facility] [place]

## 2020-08-22 NOTE — Progress Notes (Signed)
Urological Symptom Review  Patient is experiencing the following symptoms: Kidney stones   Review of Systems  Gastrointestinal (upper)  : Negative for upper GI symptoms  Gastrointestinal (lower) : Negative for lower GI symptoms  Constitutional : Negative for symptoms  Skin: Negative for skin symptoms  Eyes: Negative for eye symptoms  Ear/Nose/Throat : Negative for Ear/Nose/Throat symptoms  Hematologic/Lymphatic: Negative for Hematologic/Lymphatic symptoms  Cardiovascular : Negative for cardiovascular symptoms  Respiratory : Negative for respiratory symptoms  Endocrine: Negative for endocrine symptoms  Musculoskeletal: Negative for musculoskeletal symptoms  Neurological: Negative for neurological symptoms  Psychologic: Negative for psychiatric symptoms

## 2020-08-22 NOTE — Patient Instructions (Signed)
  Prostate-Specific Antigen Test Why am I having this test? The prostate-specific antigen (PSA) test is a screening test for prostate cancer. It can identify early signs of prostate cancer, which may allow for more effective treatment. Your health care provider may recommend that you have a PSA test starting at age 64 or that you have one earlier or later, depending on your risk factors for prostate cancer. You may also have a PSA test:  To monitor treatment of prostate cancer.  To check whether prostate cancer has returned after treatment.  If you have signs of other conditions that can affect PSA levels, such as: ? An enlarged prostate that is not caused by cancer (benign prostatic hyperplasia, BPH). This condition is very common in older men. ? A prostate infection. What is being tested? This test measures the amount of PSA in your blood. PSA is a protein that is made in the prostate. The prostate naturally produces more PSA as you age, but very high levels may be a sign of a medical condition. What kind of sample is taken? A blood sample is required for this test. It is usually collected by inserting a needle into a blood vessel or by sticking a finger with a small needle. Blood for this test should be drawn before having an exam of the prostate.   How do I prepare for this test? Do not ejaculate starting 24 hours before your test, or as long as told by your health care provider. Tell a health care provider about:  Any allergies you have.  All medicines you are taking, including vitamins, herbs, eye drops, creams, and over-the-counter medicines. This also includes: ? Medicines to assist with hair growth, such as finasteride. ? Any recent exposure to a medicine called diethylstilbestrol.  Any blood disorders you have.  Any recent procedures you have had, especially any procedures involving the prostate or rectum.  Any medical conditions you have.  Any recent urinary tract  infections (UTIs) you have had. How are the results reported? Your test results will be reported as a value that indicates how much PSA is in your blood. This will be given as nanograms of PSA per milliliter of blood (ng/mL). Your health care provider will compare your results to normal ranges that were established after testing a large group of people (reference ranges). Reference ranges may vary among labs and hospitals. PSA levels vary from person to person and generally increase with age. Because of this variation, there is no single PSA value that is considered normal for everyone. Instead, PSA reference ranges are used to describe whether your PSA levels are considered low or high (elevated). Common reference ranges are:  Low: 0-2.5 ng/mL.  Slightly to moderately elevated: 2.6-10.0 ng/mL.  Moderately elevated: 10.0-19.9 ng/mL.  Significantly elevated: 20 ng/mL or greater. Sometimes, the test results may report that a condition is present when it is not present (false-positive result). What do the results mean? A test result that is higher than 4 ng/mL may mean that you are at an increased risk for prostate cancer. However, a PSA test by itself is not enough to diagnose prostate cancer. High PSA levels may also be caused by the natural aging process, prostate infection, or BPH. PSA screening cannot tell you if your PSA is high due to cancer or a different cause. A prostate biopsy is the only way to diagnose prostate cancer. A risk of having the PSA test is diagnosing and treating prostate cancer that would never   have caused any symptoms or problems (overdiagnosis and overtreatment). Talk with your health care provider about what your results mean. Questions to ask your health care provider Ask your health care provider, or the department that is doing the test:  When will my results be ready?  How will I get my results?  What are my treatment options?  What other tests do I  need?  What are my next steps? Summary  The prostate-specific antigen (PSA) test is a screening test for prostate cancer.  Your health care provider may recommend that you have a PSA test starting at age 64 or that you have one earlier or later, depending on your risk factors for prostate cancer.  A test result that is higher than 4 ng/mL may mean that you are at an increased risk for prostate cancer. However, elevated levels can be caused by a number of conditions other than prostate cancer.  Talk with your health care provider about what your results mean. This information is not intended to replace advice given to you by your health care provider. Make sure you discuss any questions you have with your health care provider. Document Revised: 01/26/2020 Document Reviewed: 01/26/2020 Elsevier Patient Education  2021 [employer].  

## 2020-10-10 ENCOUNTER — Other Ambulatory Visit: Payer: Self-pay | Admitting: Family Medicine

## 2020-10-10 DIAGNOSIS — I10 Essential (primary) hypertension: Secondary | ICD-10-CM

## 2020-11-15 ENCOUNTER — Telehealth: Payer: Self-pay | Admitting: Family Medicine

## 2020-11-15 ENCOUNTER — Other Ambulatory Visit: Payer: Self-pay

## 2020-11-15 DIAGNOSIS — I10 Essential (primary) hypertension: Secondary | ICD-10-CM

## 2020-11-15 DIAGNOSIS — Z13220 Encounter for screening for lipoid disorders: Secondary | ICD-10-CM

## 2020-11-15 MED ORDER — LOSARTAN POTASSIUM 100 MG PO TABS: 100.0000 mg | ORAL_TABLET | Freq: Every day | ORAL | 0 refills | Status: DC

## 2020-11-15 NOTE — Telephone Encounter (Signed)
Patient has been informed of his refill and labs, he will have these done Monday morning.

## 2020-11-15 NOTE — Telephone Encounter (Signed)
[place] requesting refill on Losartan 100 mg tablet. Take one tablet po daily. Pt had televisit 08/15/20 for HTN. Please advise. Thank you.

## 2020-11-23 LAB — COMPREHENSIVE METABOLIC PANEL
ALT: 39 IU/L (ref 0–44)
AST: 33 IU/L (ref 0–40)
Albumin/Globulin Ratio: 1.6 (ref 1.2–2.2)
Albumin: 4.4 g/dL (ref 3.8–4.8)
Alkaline Phosphatase: 86 IU/L (ref 44–121)
BUN/Creatinine Ratio: 12 (ref 10–24)
BUN: 13 mg/dL (ref 8–27)
Bilirubin Total: 0.6 mg/dL (ref 0.0–1.2)
CO2: 25 mmol/L (ref 20–29)
Calcium: 9.4 mg/dL (ref 8.6–10.2)
Chloride: 101 mmol/L (ref 96–106)
Creatinine, Ser: 1.05 mg/dL (ref 0.76–1.27)
Globulin, Total: 2.7 g/dL (ref 1.5–4.5)
Glucose: 164 mg/dL — ABNORMAL HIGH (ref 65–99)
Potassium: 4.2 mmol/L (ref 3.5–5.2)
Sodium: 143 mmol/L (ref 134–144)
Total Protein: 7.1 g/dL (ref 6.0–8.5)
eGFR: 79 mL/min/{1.73_m2} (ref >59)

## 2020-11-23 LAB — LIPID PANEL
Chol/HDL Ratio: 4.9 ratio (ref 0.0–5.0)
Cholesterol, Total: 175 mg/dL (ref 100–199)
HDL: 36 mg/dL — ABNORMAL LOW (ref >39)
LDL Chol Calc (NIH): 112 mg/dL — ABNORMAL HIGH (ref 0–99)
Triglycerides: 153 mg/dL — ABNORMAL HIGH (ref 0–149)
VLDL Cholesterol Cal: 27 mg/dL (ref 5–40)

## 2020-11-23 LAB — CBC WITH DIFFERENTIAL/PLATELET
Basophils Absolute: 0.1 10*3/uL (ref 0.0–0.2)
Basos: 1 %
EOS (ABSOLUTE): 0.6 10*3/uL — ABNORMAL HIGH (ref 0.0–0.4)
Eos: 6 %
Hematocrit: 47.2 % (ref 37.5–51.0)
Hemoglobin: 15.7 g/dL (ref 13.0–17.7)
Immature Grans (Abs): 0.2 10*3/uL — ABNORMAL HIGH (ref 0.0–0.1)
Immature Granulocytes: 2 %
Lymphocytes Absolute: 2.2 10*3/uL (ref 0.7–3.1)
Lymphs: 24 %
MCH: 30.2 pg (ref 26.6–33.0)
MCHC: 33.3 g/dL (ref 31.5–35.7)
MCV: 91 fL (ref 79–97)
Monocytes Absolute: 0.6 10*3/uL (ref 0.1–0.9)
Monocytes: 7 %
Neutrophils Absolute: 5.5 10*3/uL (ref 1.4–7.0)
Neutrophils: 60 %
Platelets: 192 10*3/uL (ref 150–450)
RBC: 5.2 x10E6/uL (ref 4.14–5.80)
RDW: 13.3 % (ref 11.6–15.4)
WBC: 9.1 10*3/uL (ref 3.4–10.8)

## 2020-11-23 LAB — HEMOGLOBIN A1C
Est. average glucose Bld gHb Est-mCnc: 146 mg/dL
Hgb A1c MFr Bld: 6.7 % — ABNORMAL HIGH (ref 4.8–5.6)

## 2020-11-29 ENCOUNTER — Other Ambulatory Visit: Payer: Self-pay

## 2020-11-29 ENCOUNTER — Encounter: Payer: Self-pay | Admitting: Family Medicine

## 2020-11-29 ENCOUNTER — Ambulatory Visit (INDEPENDENT_AMBULATORY_CARE_PROVIDER_SITE_OTHER): Payer: 59 | Admitting: Family Medicine

## 2020-11-29 VITALS — BP 132/86 | HR 75 | Temp 98.2°F | Ht 71.0 in | Wt 246.8 lb

## 2020-11-29 DIAGNOSIS — E119 Type 2 diabetes mellitus without complications: Secondary | ICD-10-CM

## 2020-11-29 DIAGNOSIS — I10 Essential (primary) hypertension: Secondary | ICD-10-CM | POA: Diagnosis not present

## 2020-11-29 DIAGNOSIS — E782 Mixed hyperlipidemia: Secondary | ICD-10-CM | POA: Diagnosis not present

## 2020-11-29 MED ORDER — HYDROCHLOROTHIAZIDE 25 MG PO TABS: 12.5000 mg | ORAL_TABLET | Freq: Every day | ORAL | 1 refills | Status: DC

## 2020-11-29 MED ORDER — LOSARTAN POTASSIUM 100 MG PO TABS: 100.0000 mg | ORAL_TABLET | Freq: Every day | ORAL | 1 refills | Status: DC

## 2020-11-29 MED ORDER — METFORMIN HCL ER 500 MG PO TB24: 500.0000 mg | ORAL_TABLET | Freq: Every day | ORAL | 1 refills | Status: DC

## 2020-11-29 NOTE — Progress Notes (Signed)
Patient ID: Oscar Campbell, male    DOB: [birth year], 65 y.o.   MRN: [id number]   Chief Complaint  Patient presents with   Hypertension    Go over labs    Subjective:    HPI  HTN Pt compliant with BP meds.  No SEs Denies chest pain, sob, LE swelling, or blurry vision.  Pt stating has been told elevated bg in past.  Eating chips, not much breads/pasta/rices.  Breakfast- busicuits, and gravy.   Has h/o impaired fasting glucose. Today on review of labs has diabetes. A1c at 6.7. Denies inc thirst or urination.  Denies sores, cut, or ulcers on feet.   Medical History [place] has a past medical history of Complication of anesthesia, Elevated PSA, Erectile dysfunction, Hypertension, Impaired fasting glucose, Kidney stones, Obesity, and Seasonal rhinitis.   Outpatient Encounter Medications as of 11/29/2020  Medication Sig   metFORMIN (GLUCOPHAGE-XR) 500 MG 24 hr tablet Take 1 tablet (500 mg total) by mouth daily with breakfast.   aspirin EC 81 MG tablet Take 81 mg by mouth daily.   hydrochlorothiazide (HYDRODIURIL) 25 MG tablet Take 0.5 tablets (12.5 mg total) by mouth daily.   losartan (COZAAR) 100 MG tablet Take 1 tablet (100 mg total) by mouth daily.   [DISCONTINUED] hydrochlorothiazide (HYDRODIURIL) 25 MG tablet TAKE 1/2 (ONE-HALF) TABLET BY MOUTH ONCE DAILY IN THE MORNING   [DISCONTINUED] losartan (COZAAR) 100 MG tablet Take 1 tablet (100 mg total) by mouth daily.   No facility-administered encounter medications on file as of 11/29/2020.     Review of Systems  Constitutional:  Negative for chills and fever.  HENT:  Negative for congestion, rhinorrhea and sore throat.   Respiratory:  Negative for cough, shortness of breath and wheezing.   Cardiovascular:  Negative for chest pain and leg swelling.  Gastrointestinal:  Negative for abdominal pain, diarrhea, nausea and vomiting.  Genitourinary:  Negative for dysuria and frequency.  Skin:  Negative for rash.  Neurological:   Negative for dizziness, weakness and headaches.    Vitals BP 132/86   Pulse 75   Temp 98.2 F (36.8 C)   Ht 5\' 11"  (1.803 m)   Wt 246 lb 12.8 oz (111.9 kg)   SpO2 99%   BMI 34.42 kg/m   Objective:   Physical Exam Vitals and nursing note reviewed.  Constitutional:      General: He is not in acute distress.    Appearance: Normal appearance. He is not ill-appearing.  HENT:     Head: Normocephalic.     Nose: Nose normal. No congestion.     Mouth/Throat:     Mouth: Mucous membranes are moist.     Pharynx: No oropharyngeal exudate.  Eyes:     Extraocular Movements: Extraocular movements intact.     Conjunctiva/sclera: Conjunctivae normal.     Pupils: Pupils are equal, round, and reactive to light.  Cardiovascular:     Rate and Rhythm: Normal rate and regular rhythm.     Pulses: Normal pulses.     Heart sounds: Normal heart sounds. No murmur heard. Pulmonary:     Effort: Pulmonary effort is normal.     Breath sounds: Normal breath sounds. No wheezing, rhonchi or rales.  Musculoskeletal:        General: Normal range of motion.     Right lower leg: No edema.     Left lower leg: No edema.  Skin:    General: Skin is warm and dry.     Findings:  No rash.  Neurological:     General: No focal deficit present.     Mental Status: He is alert and oriented to person, place, and time.     Cranial Nerves: No cranial nerve deficit.  Psychiatric:        Mood and Affect: Mood normal.        Behavior: Behavior normal.        Thought Content: Thought content normal.        Judgment: Judgment normal.     Assessment and Plan   1. Essential hypertension - losartan (COZAAR) 100 MG tablet; Take 1 tablet (100 mg total) by mouth daily.  Dispense: 90 tablet; Refill: 1 - hydrochlorothiazide (HYDRODIURIL) 25 MG tablet; Take 0.5 tablets (12.5 mg total) by mouth daily.  Dispense: 45 tablet; Refill: 1  2. New onset type 2 diabetes mellitus (HCC) - metFORMIN (GLUCOPHAGE-XR) 500 MG 24 hr  tablet; Take 1 tablet (500 mg total) by mouth daily with breakfast.  Dispense: 90 tablet; Refill: 1   Htn- suboptimal. Cont to watch salt in diet and cont meds.  Dm2- new onset.  a1c increased to 6.7.  recommending starting metformin 500mg  xr and watch diet.    Return in about 3 months (around 03/01/2021) for f/u dm, htn, .   12/16/2020

## 2020-12-16 ENCOUNTER — Encounter: Payer: Self-pay | Admitting: Family Medicine

## 2020-12-16 DIAGNOSIS — E119 Type 2 diabetes mellitus without complications: Secondary | ICD-10-CM | POA: Insufficient documentation

## 2020-12-16 DIAGNOSIS — E782 Mixed hyperlipidemia: Secondary | ICD-10-CM | POA: Insufficient documentation

## 2020-12-21 ENCOUNTER — Telehealth: Payer: 59 | Admitting: Emergency Medicine

## 2020-12-21 DIAGNOSIS — J029 Acute pharyngitis, unspecified: Secondary | ICD-10-CM

## 2020-12-21 MED ORDER — BENZONATATE 100 MG PO CAPS: 100.0000 mg | ORAL_CAPSULE | Freq: Two times a day (BID) | ORAL | 0 refills | Status: DC | PRN

## 2020-12-21 MED ORDER — FLUTICASONE PROPIONATE 50 MCG/ACT NA SUSP: 2.0000 | Freq: Every day | NASAL | 0 refills | Status: DC

## 2020-12-21 NOTE — Progress Notes (Signed)

## 2021-04-04 ENCOUNTER — Ambulatory Visit: Payer: 59 | Admitting: Family Medicine

## 2021-04-09 ENCOUNTER — Other Ambulatory Visit: Payer: 59

## 2021-04-24 ENCOUNTER — Other Ambulatory Visit: Payer: Self-pay

## 2021-04-24 ENCOUNTER — Ambulatory Visit (INDEPENDENT_AMBULATORY_CARE_PROVIDER_SITE_OTHER): Payer: 59 | Admitting: Family Medicine

## 2021-04-24 ENCOUNTER — Encounter: Payer: Self-pay | Admitting: Family Medicine

## 2021-04-24 VITALS — BP 145/84 | HR 82 | Temp 97.2°F | Ht 71.0 in | Wt 254.0 lb

## 2021-04-24 DIAGNOSIS — E119 Type 2 diabetes mellitus without complications: Secondary | ICD-10-CM

## 2021-04-24 DIAGNOSIS — E782 Mixed hyperlipidemia: Secondary | ICD-10-CM

## 2021-04-24 DIAGNOSIS — I10 Essential (primary) hypertension: Secondary | ICD-10-CM

## 2021-04-24 NOTE — Assessment & Plan Note (Signed)
At goal.  Continue current dosing of metformin.  We will repeat labs at next visit.  Patient declined labs today.

## 2021-04-24 NOTE — Assessment & Plan Note (Signed)
Uncontrolled.  We discussed lipid-lowering therapy.  Patient wants to wait at this time until his labs are repeated.

## 2021-04-24 NOTE — Assessment & Plan Note (Signed)
BP mildly elevated here today.  BPs better controlled at home.  Continue current therapy with losartan and HCTZ.

## 2021-04-24 NOTE — Progress Notes (Signed)
Subjective:  Patient ID: Oscar Campbell, male    DOB: 1955-12-05  Age: 65 y.o. MRN: [id number]  CC: Chief Complaint  Patient presents with   Diabetes    HPI:  65 year old male with hypertension, hyperlipidemia, type 2 diabetes presents for follow-up and to establish care with me.  DM-2 Last A1c was at goal, 6.7.  Patient is compliant with metformin 500 mg daily.  He tries to watch his diet. No troubles with his feet or with his vision.  Has not recently had an eye exam. Does not check his blood sugars.  Hypertension BP mildly elevated today.  He states that his home readings are fairly well controlled in the 540G systolic.  He is compliant with losartan and HCTZ.  Hyperlipidemia Patient most recent LDL was 112 with mildly elevated triglycerides and low HDL.  He is not currently on any lipid-lowering therapy.  We will discuss today.  Patient Active Problem List   Diagnosis Date Noted   Type 2 diabetes mellitus without complications ([place]) 86/76/1949   Mixed hyperlipidemia 12/16/2020   Elevated PSA 08/22/2020   Kidney stones 05/15/2015   Diverticulosis of colon without hemorrhage    Hx of adenomatous colonic polyps 02/12/2015   Erectile dysfunction 01/20/2013   Essential hypertension 01/20/2011    Social Hx   Social History   Socioeconomic History   Marital status: Divorced    Spouse name: Not on file   Number of children: Not on file   Years of education: Not on file   Highest education level: Not on file  Occupational History   Not on file  Tobacco Use   Smoking status: Never   Smokeless tobacco: Never  Substance and Sexual Activity   Alcohol use: No    Alcohol/week: 0.0 standard drinks   Drug use: No   Sexual activity: Yes  Other Topics Concern   Not on file  Social History Narrative   Not on file   Social Determinants of Health   Financial Resource Strain: Not on file  Food Insecurity: Not on file  Transportation Needs: Not on file  Physical Activity:  Not on file  Stress: Not on file  Social Connections: Not on file    Review of Systems  Constitutional: Negative.   Respiratory: Negative.    Cardiovascular: Negative.     Objective:  BP (!) 145/84   Pulse 82   Temp (!) 97.2 F (36.2 C)   Ht 5\' 11"  (1.803 m)   Wt 254 lb (115.2 kg)   SpO2 100%   BMI 35.43 kg/m   BP/Weight 04/24/2021 11/29/2020 9/32/6712  Systolic BP 458 099 833  Diastolic BP 84 86 86  Wt. (Lbs) 254 246.8 242  BMI 35.43 34.42 33.75    Physical Exam Vitals and nursing note reviewed.  Constitutional:      General: He is not in acute distress.    Appearance: Normal appearance. He is obese.  HENT:     Head: Normocephalic and atraumatic.  Eyes:     General:        Right eye: No discharge.        Left eye: No discharge.     Conjunctiva/sclera: Conjunctivae normal.  Cardiovascular:     Rate and Rhythm: Normal rate and regular rhythm.     Heart sounds: No murmur heard. Pulmonary:     Effort: Pulmonary effort is normal.     Breath sounds: Normal breath sounds. No wheezing, rhonchi or rales.  Neurological:  Mental Status: He is alert.  Psychiatric:        Mood and Affect: Mood normal.        Behavior: Behavior normal.    Lab Results  Component Value Date   WBC 9.1 11/22/2020   HGB 15.7 11/22/2020   HCT 47.2 11/22/2020   PLT 192 11/22/2020   GLUCOSE 164 (H) 11/22/2020   CHOL 175 11/22/2020   TRIG 153 (H) 11/22/2020   HDL 36 (L) 11/22/2020   LDLCALC 112 (H) 11/22/2020   ALT 39 11/22/2020   AST 33 11/22/2020   NA 143 11/22/2020   K 4.2 11/22/2020   CL 101 11/22/2020   CREATININE 1.05 11/22/2020   BUN 13 11/22/2020   CO2 25 11/22/2020   PSA 1.03 12/15/2013   HGBA1C 6.7 (H) 11/22/2020     Assessment & Plan:   Problem List Items Addressed This Visit       Cardiovascular and Mediastinum   Essential hypertension    BP mildly elevated here today.  BPs better controlled at home.  Continue current therapy with losartan and HCTZ.         Endocrine   Type 2 diabetes mellitus without complications ([place]) - Primary    At goal.  Continue current dosing of metformin.  We will repeat labs at next visit.  Patient declined labs today.        Other   Mixed hyperlipidemia    Uncontrolled.  We discussed lipid-lowering therapy.  Patient wants to wait at this time until his labs are repeated.      Follow-up:  Return in about 6 months (around 10/22/2021) for Diabetes follow up, HTN follow up.  [place]

## 2021-04-24 NOTE — Patient Instructions (Signed)
Continue your current medications.  We will do labs at your next visit.  Take care   Dr. Lacinda Axon

## 2021-05-16 ENCOUNTER — Other Ambulatory Visit: Payer: Self-pay | Admitting: Family Medicine

## 2021-05-16 DIAGNOSIS — E119 Type 2 diabetes mellitus without complications: Secondary | ICD-10-CM

## 2021-06-04 ENCOUNTER — Other Ambulatory Visit: Payer: Self-pay

## 2021-06-04 ENCOUNTER — Ambulatory Visit (INDEPENDENT_AMBULATORY_CARE_PROVIDER_SITE_OTHER): Payer: 59 | Admitting: Family Medicine

## 2021-06-04 VITALS — BP 128/83 | HR 99 | Temp 98.3°F | Ht 71.0 in | Wt 252.0 lb

## 2021-06-04 DIAGNOSIS — B349 Viral infection, unspecified: Secondary | ICD-10-CM | POA: Diagnosis not present

## 2021-06-04 DIAGNOSIS — R6883 Chills (without fever): Secondary | ICD-10-CM

## 2021-06-04 NOTE — Assessment & Plan Note (Signed)
Well appearing with benign/normal physical exam. COVID/Flu/RSV PCR today. Advised Tylenol, rest, fluids.  Supportive care.

## 2021-06-04 NOTE — Progress Notes (Signed)
Subjective:  Patient ID: Oscar Campbell, male    DOB: 09/06/1955  Age: 66 y.o. MRN: [id number]  CC: Chief Complaint  Patient presents with   Chills    Hyperventilation , shaking , dry heaving, upset stomach    HPI:  66 year old male presents for evaluation of the above.  Had stomach upset/abdominal pain on Sunday. This improved. Reports not feeling well yesterday and today. Had recent episode of hyperventilation. Has had chills today. No fever, cough, URI symptoms. No SOB. No body aches. No documented fever. No reported sick contacts. He is very concerned given chills.   Patient Active Problem List   Diagnosis Date Noted   Viral illness 06/04/2021   Type 2 diabetes mellitus without complications ([place]) 76/16/0737   Mixed hyperlipidemia 12/16/2020   Elevated PSA 08/22/2020   Kidney stones 05/15/2015   Diverticulosis of colon without hemorrhage    Hx of adenomatous colonic polyps 02/12/2015   Erectile dysfunction 01/20/2013   Essential hypertension 01/20/2011    Social Hx   Social History   Socioeconomic History   Marital status: Divorced    Spouse name: Not on file   Number of children: Not on file   Years of education: Not on file   Highest education level: Not on file  Occupational History   Not on file  Tobacco Use   Smoking status: Never   Smokeless tobacco: Never  Substance and Sexual Activity   Alcohol use: No    Alcohol/week: 0.0 standard drinks   Drug use: No   Sexual activity: Yes  Other Topics Concern   Not on file  Social History Narrative   Not on file   Social Determinants of Health   Financial Resource Strain: Not on file  Food Insecurity: Not on file  Transportation Needs: Not on file  Physical Activity: Not on file  Stress: Not on file  Social Connections: Not on file    Review of Systems Per HPI  Objective:  BP 128/83    Pulse 99    Temp 98.3 F (36.8 C)    Ht 5\' 11"  (1.803 m)    Wt 252 lb (114.3 kg)    SpO2 96%    BMI 35.15 kg/m    BP/Weight 06/04/2021 10/62/6948 09/26/6268  Systolic BP 350 093 818  Diastolic BP 83 84 86  Wt. (Lbs) 252 254 246.8  BMI 35.15 35.43 34.42    Physical Exam Vitals and nursing note reviewed.  Constitutional:      General: He is not in acute distress.    Appearance: Normal appearance. He is obese. He is not ill-appearing.  HENT:     Head: Normocephalic and atraumatic.     Mouth/Throat:     Pharynx: Oropharynx is clear.  Eyes:     General:        Right eye: No discharge.        Left eye: No discharge.     Conjunctiva/sclera: Conjunctivae normal.  Cardiovascular:     Rate and Rhythm: Normal rate and regular rhythm.  Pulmonary:     Effort: Pulmonary effort is normal.     Breath sounds: Normal breath sounds. No wheezing or rales.  Neurological:     Mental Status: He is alert.  Psychiatric:        Mood and Affect: Mood normal.        Behavior: Behavior normal.    Lab Results  Component Value Date   WBC 9.1 11/22/2020   HGB  15.7 11/22/2020   HCT 47.2 11/22/2020   PLT 192 11/22/2020   GLUCOSE 164 (H) 11/22/2020   CHOL 175 11/22/2020   TRIG 153 (H) 11/22/2020   HDL 36 (L) 11/22/2020   LDLCALC 112 (H) 11/22/2020   ALT 39 11/22/2020   AST 33 11/22/2020   NA 143 11/22/2020   K 4.2 11/22/2020   CL 101 11/22/2020   CREATININE 1.05 11/22/2020   BUN 13 11/22/2020   CO2 25 11/22/2020   PSA 1.03 12/15/2013   HGBA1C 6.7 (H) 11/22/2020     Assessment & Plan:   Problem List Items Addressed This Visit       Other   Viral illness - Primary    Well appearing with benign/normal physical exam. COVID/Flu/RSV PCR today. Advised Tylenol, rest, fluids.  Supportive care.      Other Visit Diagnoses     Chills       Relevant Orders   COVID-19, Flu A+B and RSV      [place]

## 2021-06-04 NOTE — Patient Instructions (Signed)
Rest. Fluids.  Okay to hold BP meds and Metformin for a day or so.  If anything changes let me know.  Take care  Dr. Lacinda Axon

## 2021-06-05 ENCOUNTER — Telehealth: Payer: Self-pay | Admitting: Family Medicine

## 2021-06-05 NOTE — Telephone Encounter (Signed)
Coral Spikes, DO    Unless he is having urinary symptoms, this is unlikely to be a urinary tract infection.  He had no urinary symptoms yesterday.  Awaiting COVID/flu testing.  Supportive care with over-the-counter Tylenol.

## 2021-06-05 NOTE — Telephone Encounter (Signed)
Patient called this morning stating he woke up with a fever of 99.5. He states he was just here yesterday and everything looked good; he is wondering if it could be a UTI or bladder infection. He states Dr. Lovena Le told him that it wasn't uncommon in men over 17.   CB# [phone]

## 2021-06-05 NOTE — Telephone Encounter (Signed)
Patient advised per Dr Lacinda Axon: Unless he is having urinary symptoms, this is unlikely to be a urinary tract infection.  He had no urinary symptoms yesterday.  Awaiting COVID/flu testing.  Supportive care with over-the-counter Tylenol. Patient verbalized understanding.

## 2021-06-06 LAB — COVID-19, FLU A+B AND RSV
Influenza A, NAA: NOT DETECTED
Influenza B, NAA: NOT DETECTED
RSV, NAA: NOT DETECTED
SARS-CoV-2, NAA: NOT DETECTED

## 2021-06-13 ENCOUNTER — Other Ambulatory Visit: Payer: Self-pay | Admitting: Family Medicine

## 2021-06-13 DIAGNOSIS — I10 Essential (primary) hypertension: Secondary | ICD-10-CM

## 2021-08-21 ENCOUNTER — Other Ambulatory Visit: Payer: Self-pay

## 2021-08-21 ENCOUNTER — Ambulatory Visit: Payer: 59 | Admitting: Urology

## 2021-08-21 ENCOUNTER — Ambulatory Visit (HOSPITAL_COMMUNITY)
Admission: RE | Admit: 2021-08-21 | Discharge: 2021-08-21 | Disposition: A | Discharge status: Home / Self Care | Payer: 59 | Source: Ambulatory Visit | Attending: Urology | Admitting: Urology

## 2021-08-21 ENCOUNTER — Encounter: Payer: Self-pay | Admitting: Urology

## 2021-08-21 VITALS — BP 157/83 | HR 71

## 2021-08-21 DIAGNOSIS — N200 Calculus of kidney: Secondary | ICD-10-CM | POA: Diagnosis not present

## 2021-08-21 DIAGNOSIS — R972 Elevated prostate specific antigen [PSA]: Secondary | ICD-10-CM

## 2021-08-21 LAB — URINALYSIS, ROUTINE W REFLEX MICROSCOPIC
Bilirubin, UA: NEGATIVE
Ketones, UA: NEGATIVE
Nitrite, UA: POSITIVE — AB
Protein,UA: NEGATIVE
Specific Gravity, UA: 1.025 (ref 1.005–1.030)
Urobilinogen, Ur: 0.2 mg/dL (ref 0.2–1.0)
pH, UA: 5.5 (ref 5.0–7.5)

## 2021-08-21 LAB — MICROSCOPIC EXAMINATION: RBC, Urine: NONE SEEN /hpf (ref 0–2)

## 2021-08-21 NOTE — Progress Notes (Signed)
? ?08/21/2021 ?10:39 AM  ? ?Oscar Campbell ?Nov 12, 1955 ?[id number] ? ?Referring provider: Erven Colla, DO ?[address] ?[place],  [place] [address] ? ?Followup nephrolithiasis and Elevated PSA ? ? ?HPI: ? ?Oscar Campbell is a 66yo here for followup for elevated PSA and nephrolithiasis. No stone events since last visit. No recent abdominal imaging. No recent PSA. IPPS 2 QOL 0 on no BPH therapy. ? ?PMH: ?Past Medical History:  ?Diagnosis Date  ? Complication of anesthesia   ? woke up during 1st colonscopy  ? Elevated PSA   ? Erectile dysfunction   ? Hypertension   ? Impaired fasting glucose   ? Kidney stones   ? Obesity   ? Seasonal rhinitis   ? ? ?Surgical History: ?Past Surgical History:  ?Procedure Laterality Date  ? CARDIOVASCULAR STRESS TEST    ? COLONOSCOPY  2009  ? Dr. Gala Romney: tubular adenoma  ? COLONOSCOPY N/A 03/02/2015  ? Procedure: COLONOSCOPY;  Surgeon: Daneil Dolin, MD;  Location: [facility];  Service: Endoscopy;  Laterality: N/A;  0830  ? CYSTOSCOPY WITH STENT PLACEMENT Right 05/15/2015  ? Procedure: CYSTOSCOPY WITH EXTERNALIZED STENT PLACEMENT;  Surgeon: Cleon Gustin, MD;  Location: [facility];  Service: Urology;  Laterality: Right;  ? HOLMIUM LASER APPLICATION Right 00/93/8182  ? Procedure: HOLMIUM LASER APPLICATION;  Surgeon: Cleon Gustin, MD;  Location: [facility];  Service: Urology;  Laterality: Right;  ? NEPHROLITHOTOMY Right 05/15/2015  ? Procedure: NEPHROLITHOTOMY PERCUTANEOUS WITH RIGHT NEPHROSTOMY TUBE PLACEMENT, SURGEON TO OBTAIN ACCESS;  Surgeon: Cleon Gustin, MD;  Location: [facility];  Service: Urology;  Laterality: Right;  ? ? ?Home Medications:  ?Allergies as of 08/21/2021   ? ?   Reactions  ? Penicillins Rash  ? Has patient had a PCN reaction causing immediate rash, facial/tongue/throat swelling, SOB or lightheadedness with hypotension: unkown ?Has patient had a PCN reaction causing severe rash involving mucus membranes or skin necrosis: no ?Has patient had a PCN reaction that required  hospitalization no ?Has patient had a PCN reaction occurring within the last 10 years: no ?If all of the above answers are "NO", then may proceed with Cephalosporin use.  ? ?  ? ?  ?Medication List  ?  ? ?  ? Accurate as of August 21, 2021 10:39 AM. If you have any questions, ask your nurse or doctor.  ?  ?  ? ?  ? ?aspirin EC 81 MG tablet ?Take 81 mg by mouth daily. ?  ?fluticasone 50 MCG/ACT nasal spray ?Commonly known as: FLONASE ?Place 2 sprays into both nostrils daily. ?  ?hydrochlorothiazide 25 MG tablet ?Commonly known as: HYDRODIURIL ?Take 1/2 (one-half) tablet by mouth once daily ?  ?losartan 100 MG tablet ?Commonly known as: COZAAR ?Take 1 tablet by mouth once daily ?  ?metFORMIN 500 MG 24 hr tablet ?Commonly known as: GLUCOPHAGE-XR ?Take 1 tablet by mouth once daily with breakfast ?  ? ?  ? ? ?Allergies:  ?Allergies  ?Allergen Reactions  ? Penicillins Rash  ?  Has patient had a PCN reaction causing immediate rash, facial/tongue/throat swelling, SOB or lightheadedness with hypotension: unkown ?Has patient had a PCN reaction causing severe rash involving mucus membranes or skin necrosis: no ?Has patient had a PCN reaction that required hospitalization no ?Has patient had a PCN reaction occurring within the last 10 years: no ?If all of the above answers are "NO", then may proceed with Cephalosporin use.  ? ? ?Family History: ?Family History  ?Problem Relation  Age of Onset  ? Heart disease Father   ? Colon cancer Neg Hx   ? ? ?Social History:  reports that he has never smoked. He has never used smokeless tobacco. He reports that he does not drink alcohol and does not use drugs. ? ?ROS: ?All other review of systems were reviewed and are negative except what is noted above in HPI ? ?Physical Exam: ?BP (!) 157/83   Pulse 71   ?Constitutional:  Alert and oriented, No acute distress. ?HEENT: [place] AT, moist mucus membranes.  Trachea midline, no masses. ?Cardiovascular: No clubbing, cyanosis, or edema. ?Respiratory:  Normal respiratory effort, no increased work of breathing. ?GI: Abdomen is soft, nontender, nondistended, no abdominal masses ?GU: No CVA tenderness.  ?Lymph: No cervical or inguinal lymphadenopathy. ?Skin: No rashes, bruises or suspicious lesions. ?Neurologic: Grossly intact, no focal deficits, moving all 4 extremities. ?Psychiatric: Normal mood and affect. ? ?Laboratory Data: ?Lab Results  ?Component Value Date  ? WBC 9.1 11/22/2020  ? HGB 15.7 11/22/2020  ? HCT 47.2 11/22/2020  ? MCV 91 11/22/2020  ? PLT 192 11/22/2020  ? ? ?Lab Results  ?Component Value Date  ? CREATININE 1.05 11/22/2020  ? ? ?Lab Results  ?Component Value Date  ? PSA 1.03 12/15/2013  ? PSA 0.91 01/22/2013  ? ? ?No results found for: TESTOSTERONE ? ?Lab Results  ?Component Value Date  ? HGBA1C 6.7 (H) 11/22/2020  ? ? ?Urinalysis ?   ?Component Value Date/Time  ? [place] YELLOW 04/23/2015 0520  ? APPEARANCEUR Clear 08/22/2020 0914  ? LABSPEC >1.030 (H) 04/23/2015 0520  ? PHURINE 6.0 04/23/2015 0520  ? GLUCOSEU Negative 08/22/2020 0914  ? HGBUR MODERATE (A) 04/23/2015 0520  ? BILIRUBINUR Negative 08/22/2020 0914  ? [place] NEGATIVE 04/23/2015 0520  ? PROTEINUR Negative 08/22/2020 0914  ? PROTEINUR 100 (A) 04/23/2015 0520  ? NITRITE Positive (A) 08/22/2020 0914  ? NITRITE NEGATIVE 04/23/2015 0520  ? LEUKOCYTESUR 1+ (A) 08/22/2020 0914  ? ? ?Lab Results  ?Component Value Date  ? LABMICR See below: 08/22/2020  ? [place] 6-10 (A) 08/22/2020  ? LABEPIT 0-10 08/22/2020  ? BACTERIA [place] (A) 08/22/2020  ? ? ?Pertinent Imaging: ? ?Results for orders placed during the hospital encounter of 08/22/20 ? ?Abdomen 1 view (KUB) ? ?Narrative ?CLINICAL DATA:  History of right kidney stone ? ?EXAM: ?ABDOMEN - 1 VIEW ? ?COMPARISON:  03/13/2017, CT 02/07/2020 ? ?FINDINGS: ?Lung bases are clear. No radiopaque calculi projecting over the ?kidneys. Bowel gas pattern is unremarkable. ? ?IMPRESSION: ?Negative. ? ? ?Electronically Signed ?By: Donavan Foil M.D. ?On:  08/22/2020 19:28 ? ?No results found for this or any previous visit. ? ?No results found for this or any previous visit. ? ?No results found for this or any previous visit. ? ?Results for orders placed during the hospital encounter of 03/27/17 ? ?US RENAL ? ?Narrative ?CLINICAL DATA:  Follow-up renal calculus and left-sided cyst. ? ?EXAM: ?RENAL / URINARY TRACT ULTRASOUND COMPLETE ? ?COMPARISON:  March 17, 2016 ? ?FINDINGS: ?Right Kidney: ? ?Length: 12.7 cm. There is a 9 mm shadowing calculus in the lower ?pole of the right kidney, unchanged in the interval with no evidence ?of hydronephrosis/obstruction. ? ?Left Kidney: ? ?Length: 12.7 cm. There is a 2 x 1.4 x 1.2 cm cyst in the upper pole ?of the left kidney versus 1.4 x 1.2 x 1.1 cm on the previous study. ? ?Bladder: ? ?Appears normal for degree of bladder distention. ? ?IMPRESSION: ?1. Nonshadowing stone in the lower  pole of the right kidney, ?unchanged. ?2. The cyst in the upper pole of the left kidney is a little larger ?in the interval now measuring up to 2 cm. ? ? ?Electronically Signed ?By: Dorise Bullion III M.D ?On: 03/28/2017 07:15 ? ?No results found for this or any previous visit. ? ?No results found for this or any previous visit. ? ?Results for orders placed in visit on 02/06/20 ? ?CT RENAL STONE STUDY ? ?Narrative ?CLINICAL DATA:  Bilateral flank pain for 1 week. ? ?EXAM: ?CT ABDOMEN AND PELVIS WITHOUT CONTRAST ? ?TECHNIQUE: ?Multidetector CT imaging of the abdomen and pelvis was performed ?following the standard protocol without IV contrast. ? ?COMPARISON:  04/23/2015 ? ?FINDINGS: ?Lower chest: No acute pulmonary findings. No pleural or pericardial ?effusion. 3.5 mm nodule in the right middle lobe is unchanged since ?2016 and considered benign. ? ?Hepatobiliary: No hepatic lesions are identified without contrast. ?No intrahepatic biliary dilatation. The gallbladder appears normal. ?No common bile duct dilatation. ? ?Pancreas: No mass,  inflammation or ductal dilatation. ? ?Spleen: The spleen is upper limits of normal in size. No splenic ?lesions. ? ?Adrenals/Urinary Tract: Small left adrenal gland nodule is stable ?and consistent with a benign aden

## 2021-08-21 NOTE — Patient Instructions (Signed)
Dietary Guidelines to Help Prevent Kidney Stones Kidney stones are deposits of minerals and salts that form inside your kidneys. Your risk of developing kidney stones may be greater depending on your diet, your lifestyle, the medicines you take, and whether you have certain medical conditions. Most people can lower their chances of developing kidney stones by following the instructions below. Your dietitian may give you more specific instructions depending on your overall health and the type of kidney stones you tend to develop. What are tips for following this plan? Reading food labels  Choose foods with "no salt added" or "low-salt" labels. Limit your salt (sodium) intake to less than 1,500 mg a day. Choose foods with calcium for each meal and snack. Try to eat about 300 mg of calcium at each meal. Foods that contain 200-500 mg of calcium a serving include: 8 oz (237 mL) of milk, calcium-fortifiednon-dairy milk, and calcium-fortifiedfruit juice. Calcium-fortified means that calcium has been added to these drinks. 8 oz (237 mL) of kefir, yogurt, and soy yogurt. 4 oz (114 g) of tofu. 1 oz (28 g) of cheese. 1 cup (150 g) of dried figs. 1 cup (91 g) of cooked broccoli. One 3 oz (85 g) can of sardines or mackerel. Most people need 1,000-1,500 mg of calcium a day. Talk to your dietitian about how much calcium is recommended for you. Shopping Buy plenty of fresh fruits and vegetables. Most people do not need to avoid fruits and vegetables, even if these foods contain nutrients that may contribute to kidney stones. When shopping for convenience foods, choose: Whole pieces of fruit. Pre-made salads with dressing on the side. Low-fat fruit and yogurt smoothies. Avoid buying frozen meals or prepared deli foods. These can be high in sodium. Look for foods with live cultures, such as yogurt and kefir. Choose high-fiber grains, such as whole-wheat breads, oat bran, and wheat cereals. Cooking Do not add  salt to food when cooking. Place a salt shaker on the table and allow each person to add his or her own salt to taste. Use vegetable protein, such as beans, textured vegetable protein (TVP), or tofu, instead of meat in pasta, casseroles, and soups. Meal planning Eat less salt, if told by your dietitian. To do this: Avoid eating processed or pre-made food. Avoid eating fast food. Eat less animal protein, including cheese, meat, poultry, or fish, if told by your dietitian. To do this: Limit the number of times you have meat, poultry, fish, or cheese each week. Eat a diet free of meat at least 2 days a week. Eat only one serving each day of meat, poultry, fish, or seafood. When you prepare animal protein, cut pieces into small portion sizes. For most meat and fish, one serving is about the size of the palm of your hand. Eat at least five servings of fresh fruits and vegetables each day. To do this: Keep fruits and vegetables on hand for snacks. Eat one piece of fruit or a handful of berries with breakfast. Have a salad and fruit at lunch. Have two kinds of vegetables at dinner. Limit foods that are high in a substance called oxalate. These include: Spinach (cooked), rhubarb, beets, sweet potatoes, and Swiss chard. Peanuts. Potato chips, french fries, and baked potatoes with skin on. Nuts and nut products. Chocolate. If you regularly take a diuretic medicine, make sure to eat at least 1 or 2 servings of fruits or vegetables that are high in potassium each day. These include: Avocado. Banana. Orange, prune,   carrot, or tomato juice. Baked potato. Cabbage. Beans and split peas. Lifestyle  Drink enough fluid to keep your urine pale yellow. This is the most important thing you can do. Spread your fluid intake throughout the day. If you drink alcohol: Limit how much you use to: 0-1 drink a day for women who are not pregnant. 0-2 drinks a day for men. Be aware of how much alcohol is in your  drink. In the [place] one drink equals one 12 oz bottle of beer (355 mL), one 5 oz glass of wine (148 mL), or one 1 oz glass of hard liquor (44 mL). Lose weight if told by your health care provider. Work with your dietitian to find an eating plan and weight loss strategies that work best for you. General information Talk to your health care provider and dietitian about taking daily supplements. You may be told the following depending on your health and the cause of your kidney stones: Not to take supplements with vitamin C. To take a calcium supplement. To take a daily probiotic supplement. To take other supplements such as magnesium, fish oil, or vitamin B6. Take over-the-counter and prescription medicines only as told by your health care provider. These include supplements. What foods should I limit? Limit your intake of the following foods, or eat them as told by your dietitian. Vegetables Spinach. Rhubarb. Beets. Canned vegetables. Pickles. Olives. Baked potatoes with skin. Grains Wheat bran. Baked goods. Salted crackers. Cereals high in sugar. Meats and other proteins Nuts. Nut butters. Large portions of meat, poultry, or fish. Salted, precooked, or cured meats, such as sausages, meat loaves, and hot dogs. Dairy Cheese. Beverages Regular soft drinks. Regular vegetable juice. Seasonings and condiments Seasoning blends with salt. Salad dressings. Soy sauce. Ketchup. Barbecue sauce. Other foods Canned soups. Canned pasta sauce. Casseroles. Pizza. Lasagna. Frozen meals. Potato chips. French fries. The items listed above may not be a complete list of foods and beverages you should limit. Contact a dietitian for more information. What foods should I avoid? Talk to your dietitian about specific foods you should avoid based on the type of kidney stones you have and your overall health. Fruits Grapefruit. The item listed above may not be a complete list of foods and beverages you should  avoid. Contact a dietitian for more information. Summary Kidney stones are deposits of minerals and salts that form inside your kidneys. You can lower your risk of kidney stones by making changes to your diet. The most important thing you can do is drink enough fluid. Drink enough fluid to keep your urine pale yellow. Talk to your dietitian about how much calcium you should have each day, and eat less salt and animal protein as told by your dietitian. This information is not intended to replace advice given to you by your health care provider. Make sure you discuss any questions you have with your health care provider. Document Revised: 05/05/2019 Document Reviewed: 05/05/2019 Elsevier Patient Education  2022 [employer]  

## 2021-08-22 LAB — PSA: Prostate Specific Ag, Serum: 1.1 ng/mL (ref 0.0–4.0)

## 2021-08-26 ENCOUNTER — Other Ambulatory Visit: Payer: Self-pay | Admitting: Family Medicine

## 2021-08-26 DIAGNOSIS — E119 Type 2 diabetes mellitus without complications: Secondary | ICD-10-CM

## 2021-10-22 ENCOUNTER — Encounter: Payer: Self-pay | Admitting: Family Medicine

## 2021-10-22 ENCOUNTER — Ambulatory Visit (INDEPENDENT_AMBULATORY_CARE_PROVIDER_SITE_OTHER): Payer: Medicare HMO | Admitting: Family Medicine

## 2021-10-22 VITALS — BP 128/84 | HR 70 | Temp 97.7°F | Wt 251.0 lb

## 2021-10-22 DIAGNOSIS — I10 Essential (primary) hypertension: Secondary | ICD-10-CM

## 2021-10-22 DIAGNOSIS — E782 Mixed hyperlipidemia: Secondary | ICD-10-CM

## 2021-10-22 DIAGNOSIS — E119 Type 2 diabetes mellitus without complications: Secondary | ICD-10-CM | POA: Diagnosis not present

## 2021-10-22 DIAGNOSIS — Z130 Encounter for screening for diseases of the blood and blood-forming organs and certain disorders involving the immune mechanism: Secondary | ICD-10-CM | POA: Diagnosis not present

## 2021-10-22 NOTE — Patient Instructions (Addendum)
Continue your medications.  Labs this am  Follow up later this year.  Take care  Dr. Lacinda Axon

## 2021-10-22 NOTE — Assessment & Plan Note (Signed)
Labs today to assess.

## 2021-10-22 NOTE — Assessment & Plan Note (Signed)
A1c today.  Continue metformin. 

## 2021-10-22 NOTE — Assessment & Plan Note (Signed)
At goal.  Continue losartan and HCTZ. 

## 2021-10-22 NOTE — Progress Notes (Signed)
Subjective:  Patient ID: Oscar Campbell, male    DOB: 01-Dec-1955  Age: 66 y.o. MRN: [id number]  CC: Chief Complaint  Patient presents with   Hypertension   Diabetes    HPI:  66 year old male with hypertension, type 2 diabetes, hyperlipidemia presents for follow-up.  Patient's blood pressure is at goal on losartan and HCTZ.  Patient's diabetes has been at goal.  Needs updated A1c.  He is currently on metformin XR 500 mg daily.  Patient is on no pharmacotherapy regarding his lipids.  Needs lipid panel today.  Patient states that he has an eye exam coming up in October.  Declines shingles vaccine and pneumonia vaccine today.  Patient states that he is doing well.  No chest pain or shortness of breath.  Patient Active Problem List   Diagnosis Date Noted   Type 2 diabetes mellitus without complications ([place]) 87/56/4332   Mixed hyperlipidemia 12/16/2020   Kidney stones 05/15/2015   Erectile dysfunction 01/20/2013   Essential hypertension 01/20/2011    Social Hx   Social History   Socioeconomic History   Marital status: Divorced    Spouse name: Not on file   Number of children: Not on file   Years of education: Not on file   Highest education level: Not on file  Occupational History   Not on file  Tobacco Use   Smoking status: Never   Smokeless tobacco: Never  Substance and Sexual Activity   Alcohol use: No    Alcohol/week: 0.0 standard drinks   Drug use: No   Sexual activity: Yes  Other Topics Concern   Not on file  Social History Narrative   Not on file   Social Determinants of Health   Financial Resource Strain: Not on file  Food Insecurity: Not on file  Transportation Needs: Not on file  Physical Activity: Not on file  Stress: Not on file  Social Connections: Not on file    Review of Systems Per HPI  Objective:  BP 128/84   Pulse 70   Temp 97.7 F (36.5 C)   Wt 251 lb (113.9 kg)   SpO2 98%   BMI 35.01 kg/m      10/22/2021   10:04 AM  08/21/2021   10:23 AM 06/04/2021    4:20 PM  BP/Weight  Systolic BP 951 884 166  Diastolic BP 84 83 83  Wt. (Lbs) 251  252  BMI 35.01 kg/m2  35.15 kg/m2    Physical Exam Constitutional:      General: He is not in acute distress.    Appearance: Normal appearance. He is not ill-appearing.  HENT:     Head: Normocephalic and atraumatic.  Eyes:     General:        Right eye: No discharge.        Left eye: No discharge.     Conjunctiva/sclera: Conjunctivae normal.  Cardiovascular:     Rate and Rhythm: Normal rate and regular rhythm.  Pulmonary:     Effort: Pulmonary effort is normal.     Breath sounds: Normal breath sounds. No wheezing, rhonchi or rales.  Abdominal:     General: There is no distension.     Palpations: Abdomen is soft.     Tenderness: There is no abdominal tenderness.  Neurological:     Mental Status: He is alert.  Psychiatric:        Mood and Affect: Mood normal.        Behavior: Behavior normal.  Lab Results  Component Value Date   WBC 9.1 11/22/2020   HGB 15.7 11/22/2020   HCT 47.2 11/22/2020   PLT 192 11/22/2020   GLUCOSE 164 (H) 11/22/2020   CHOL 175 11/22/2020   TRIG 153 (H) 11/22/2020   HDL 36 (L) 11/22/2020   LDLCALC 112 (H) 11/22/2020   ALT 39 11/22/2020   AST 33 11/22/2020   NA 143 11/22/2020   K 4.2 11/22/2020   CL 101 11/22/2020   CREATININE 1.05 11/22/2020   BUN 13 11/22/2020   CO2 25 11/22/2020   PSA 1.03 12/15/2013   HGBA1C 6.7 (H) 11/22/2020     Assessment & Plan:   Problem List Items Addressed This Visit       Cardiovascular and Mediastinum   Essential hypertension    At goal.  Continue losartan and HCTZ.         Endocrine   Type 2 diabetes mellitus without complications (HCC) - Primary    A1c today.  Continue metformin.       Relevant Orders   CMP14+EGFR   Hemoglobin A1c   Microalbumin / creatinine urine ratio     Other   Mixed hyperlipidemia    Labs today to assess.         Relevant Orders   Lipid  panel   Other Visit Diagnoses     Screening for deficiency anemia       Relevant Orders   CBC      Follow-up:  Return in about 6 months (around 04/24/2022).  [place]

## 2021-10-23 LAB — CMP14+EGFR
ALT: 44 IU/L (ref 0–44)
AST: 39 IU/L (ref 0–40)
Albumin/Globulin Ratio: 1.5 (ref 1.2–2.2)
Albumin: 4.3 g/dL (ref 3.8–4.8)
Alkaline Phosphatase: 101 IU/L (ref 44–121)
BUN/Creatinine Ratio: 10 (ref 10–24)
BUN: 9 mg/dL (ref 8–27)
Bilirubin Total: 0.6 mg/dL (ref 0.0–1.2)
CO2: 24 mmol/L (ref 20–29)
Calcium: 9.4 mg/dL (ref 8.6–10.2)
Chloride: 101 mmol/L (ref 96–106)
Creatinine, Ser: 0.86 mg/dL (ref 0.76–1.27)
Globulin, Total: 2.8 g/dL (ref 1.5–4.5)
Glucose: 167 mg/dL — ABNORMAL HIGH (ref 70–99)
Potassium: 4 mmol/L (ref 3.5–5.2)
Sodium: 139 mmol/L (ref 134–144)
Total Protein: 7.1 g/dL (ref 6.0–8.5)
eGFR: 96 mL/min/{1.73_m2} (ref >59)

## 2021-10-23 LAB — CBC
Hematocrit: 44.8 % (ref 37.5–51.0)
Hemoglobin: 15.8 g/dL (ref 13.0–17.7)
MCH: 32 pg (ref 26.6–33.0)
MCHC: 35.3 g/dL (ref 31.5–35.7)
MCV: 91 fL (ref 79–97)
Platelets: 189 10*3/uL (ref 150–450)
RBC: 4.93 x10E6/uL (ref 4.14–5.80)
RDW: 13.6 % (ref 11.6–15.4)
WBC: 9.2 10*3/uL (ref 3.4–10.8)

## 2021-10-23 LAB — LIPID PANEL
Chol/HDL Ratio: 3.8 ratio (ref 0.0–5.0)
Cholesterol, Total: 150 mg/dL (ref 100–199)
HDL: 39 mg/dL — ABNORMAL LOW (ref >39)
LDL Chol Calc (NIH): 83 mg/dL (ref 0–99)
Triglycerides: 159 mg/dL — ABNORMAL HIGH (ref 0–149)
VLDL Cholesterol Cal: 28 mg/dL (ref 5–40)

## 2021-10-23 LAB — HEMOGLOBIN A1C
Est. average glucose Bld gHb Est-mCnc: 148 mg/dL
Hgb A1c MFr Bld: 6.8 % — ABNORMAL HIGH (ref 4.8–5.6)

## 2021-10-23 LAB — MICROALBUMIN / CREATININE URINE RATIO
Creatinine, Urine: 116.8 mg/dL
Microalb/Creat Ratio: 22 mg/g creat (ref 0–29)
Microalbumin, Urine: 25.7 ug/mL

## 2021-11-23 ENCOUNTER — Other Ambulatory Visit: Payer: Self-pay | Admitting: Family Medicine

## 2021-11-23 DIAGNOSIS — I10 Essential (primary) hypertension: Secondary | ICD-10-CM

## 2021-11-23 DIAGNOSIS — E119 Type 2 diabetes mellitus without complications: Secondary | ICD-10-CM

## 2022-02-03 ENCOUNTER — Ambulatory Visit
Admission: EM | Admit: 2022-02-03 | Discharge: 2022-02-03 | Disposition: A | Discharge status: Home / Self Care | Payer: Medicare HMO

## 2022-02-03 DIAGNOSIS — R0982 Postnasal drip: Secondary | ICD-10-CM

## 2022-02-03 DIAGNOSIS — J309 Allergic rhinitis, unspecified: Secondary | ICD-10-CM | POA: Diagnosis not present

## 2022-02-03 DIAGNOSIS — R059 Cough, unspecified: Secondary | ICD-10-CM

## 2022-02-03 MED ORDER — PROMETHAZINE-DM 6.25-15 MG/5ML PO SYRP: 5.0000 mL | ORAL_SOLUTION | Freq: Four times a day (QID) | ORAL | 0 refills | Status: DC | PRN

## 2022-02-03 MED ORDER — FLUTICASONE PROPIONATE 50 MCG/ACT NA SUSP: 2.0000 | Freq: Every day | NASAL | 0 refills | Status: DC

## 2022-02-03 NOTE — ED Provider Notes (Signed)
[facility]    CSN: [id number] Arrival date & time: 02/03/22  8295      History   Chief Complaint Chief Complaint  Patient presents with   Cough   Nasal Congestion         HPI Oscar Campbell is a 66 y.o. male.   The history is provided by the patient.   Patient presents for complaints of cough and nasal congestion that been present for the past 6 days.  He denies fever, chills, headache, ear pain, wheezing, shortness of breath, or difficulty breathing.  Patient states cough is productive of yellowish sputum.  Patient states cough is worse at night.  Patient admits to a history of seasonal allergies.  Patient has been taken Zyrtec and Mucinex with some relief.  Patient declines COVID testing.  Patient states he has received 2 COVID vaccines however.  Past Medical History:  Diagnosis Date   Complication of anesthesia    woke up during 1st colonscopy   Elevated PSA    Erectile dysfunction    Hypertension    Impaired fasting glucose    Kidney stones    Obesity    Seasonal rhinitis     Patient Active Problem List   Diagnosis Date Noted   Type 2 diabetes mellitus without complications ([place]) 62/13/0865   Mixed hyperlipidemia 12/16/2020   Kidney stones 05/15/2015   Erectile dysfunction 01/20/2013   Essential hypertension 01/20/2011    Past Surgical History:  Procedure Laterality Date   CARDIOVASCULAR STRESS TEST     COLONOSCOPY  2009   Dr. Gala Romney: tubular adenoma   COLONOSCOPY N/A 03/02/2015   Procedure: COLONOSCOPY;  Surgeon: Daneil Dolin, MD;  Location: [facility];  Service: Endoscopy;  Laterality: N/A;  0830   CYSTOSCOPY WITH STENT PLACEMENT Right 05/15/2015   Procedure: CYSTOSCOPY WITH EXTERNALIZED STENT PLACEMENT;  Surgeon: Cleon Gustin, MD;  Location: [facility];  Service: Urology;  Laterality: Right;   HOLMIUM LASER APPLICATION Right 78/46/9629   Procedure: HOLMIUM LASER APPLICATION;  Surgeon: Cleon Gustin, MD;  Location: [facility];   Service: Urology;  Laterality: Right;   NEPHROLITHOTOMY Right 05/15/2015   Procedure: NEPHROLITHOTOMY PERCUTANEOUS WITH RIGHT NEPHROSTOMY TUBE PLACEMENT, SURGEON TO OBTAIN ACCESS;  Surgeon: Cleon Gustin, MD;  Location: [facility];  Service: Urology;  Laterality: Right;       Home Medications    Prior to Admission medications   Medication Sig Start Date End Date Taking? Authorizing Provider  fexofenadine (ALLEGRA) 180 MG tablet Take 180 mg by mouth daily.   Yes [provider]  fluticasone (FLONASE) 50 MCG/ACT nasal spray Place 2 sprays into both nostrils daily. 02/03/22  Yes Shaketa Serafin-Warren, Alda Lea, NP  promethazine-dextromethorphan (PROMETHAZINE-DM) 6.25-15 MG/5ML syrup Take 5 mLs by mouth 4 (four) times daily as needed for cough. 02/03/22  Yes Deng Kemler-Warren, Alda Lea, NP  aspirin EC 81 MG tablet Take 81 mg by mouth daily.    [provider]  hydrochlorothiazide (HYDRODIURIL) 25 MG tablet Take 1/2 (one-half) tablet by mouth once daily 11/25/21   Coral Spikes, DO  losartan (COZAAR) 100 MG tablet Take 1 tablet by mouth once daily 11/25/21   Coral Spikes, DO  metFORMIN (GLUCOPHAGE-XR) 500 MG 24 hr tablet Take 1 tablet by mouth once daily with breakfast 11/25/21   Coral Spikes, DO    Family History Family History  Problem Relation Age of Onset   Heart disease Father    Colon cancer Neg Hx  Social History Social History   Tobacco Use   Smoking status: Never   Smokeless tobacco: Never  Substance Use Topics   Alcohol use: No    Alcohol/week: 0.0 standard drinks of alcohol   Drug use: No     Allergies   Penicillins   Review of Systems Review of Systems Per HPI  Physical Exam Triage Vital Signs ED Triage Vitals  Enc Vitals Group     BP 02/03/22 1124 (!) 155/96     Pulse Rate 02/03/22 1124 85     Resp 02/03/22 1124 18     Temp 02/03/22 1124 98.3 F (36.8 C)     Temp Source 02/03/22 1124 Oral     SpO2 02/03/22 1124 98 %     Weight --      Height  --      Head Circumference --      Peak Flow --      Pain Score 02/03/22 1123 0     Pain Loc --      Pain Edu? --      Excl. in [place]? --    No data found.  Updated Vital Signs BP (!) 155/96 (BP Location: Right Arm)   Pulse 85   Temp 98.3 F (36.8 C) (Oral)   Resp 18   SpO2 98%   Visual Acuity Right Eye Distance:   Left Eye Distance:   Bilateral Distance:    Right Eye Near:   Left Eye Near:    Bilateral Near:     Physical Exam Vitals and nursing note reviewed.  Constitutional:      General: He is not in acute distress.    Appearance: Normal appearance. He is well-developed.  HENT:     Head: Normocephalic and atraumatic.     Right Ear: Tympanic membrane, ear canal and external ear normal.     Left Ear: Tympanic membrane, ear canal and external ear normal.     Nose: Congestion present.     Right Turbinates: Enlarged and swollen.     Left Turbinates: Enlarged and swollen.     Right Sinus: No maxillary sinus tenderness or frontal sinus tenderness.     Left Sinus: No maxillary sinus tenderness or frontal sinus tenderness.     Mouth/Throat:     Lips: Pink.     Mouth: Mucous membranes are moist.     Pharynx: Uvula midline. Posterior oropharyngeal erythema present. No pharyngeal swelling, oropharyngeal exudate or uvula swelling.     Tonsils: No tonsillar exudate.  Eyes:     Extraocular Movements: Extraocular movements intact.     Conjunctiva/sclera: Conjunctivae normal.     Pupils: Pupils are equal, round, and reactive to light.  Neck:     Thyroid: No thyromegaly.     Trachea: No tracheal deviation.  Cardiovascular:     Rate and Rhythm: Normal rate and regular rhythm.     Heart sounds: Normal heart sounds.  Pulmonary:     Effort: Pulmonary effort is normal.     Breath sounds: Normal breath sounds.  Abdominal:     General: Bowel sounds are normal. There is no distension.     Palpations: Abdomen is soft.     Tenderness: There is no abdominal tenderness.   Musculoskeletal:     Cervical back: Normal range of motion and neck supple.  Skin:    General: Skin is warm and dry.  Neurological:     General: No focal deficit present.     Mental Status:  He is alert and oriented to person, place, and time.  Psychiatric:        Mood and Affect: Mood normal.        Behavior: Behavior normal.        Thought Content: Thought content normal.        Judgment: Judgment normal.      UC Treatments / Results  Labs (all labs ordered are listed, but only abnormal results are displayed) Labs Reviewed - No data to display  EKG   Radiology No results found.  Procedures Procedures (including critical care time)  Medications Ordered in UC Medications - No data to display  Initial Impression / Assessment and Plan / UC Course  I have reviewed the triage vital signs and the nursing notes.  Pertinent labs & imaging results that were available during my care of the patient were reviewed by me and considered in my medical decision making (see chart for details).  Patient presents with a 6-day history of cough and nasal congestion.  On exam, patient's vital signs are stable, although he is hypertensive.  Patient is in no acute distress.  There is no dictation that symptoms are of viral etiology in the absence of fever, mucopurulent drainage, or rebound symptoms.  Patient also has a known history of seasonal allergies.  COVID testing was declined by the patient.  Symptoms are consistent with allergic rhinitis based on his presentation.  Will treat patient symptomatically with Promethazine DM and fluticasone.  Patient was advised to continue Zyrtec.  Supportive care recommendations were provided to the patient.  Discussed viral etiology with the patient with strict indications of when to follow-up.  Patient verbalizes understanding.  All questions were answered. Final Clinical Impressions(s) / UC Diagnoses   Final diagnoses:  Allergic rhinitis with postnasal  drip  Cough, unspecified type     Discharge Instructions      Take medication as prescribed.  Continue Zyrtec daily as discussed. Increase fluids and allow for plenty of rest. Recommend Tylenol or ibuprofen as needed for pain, fever, or general discomfort. Recommend using a humidifier at bedtime during sleep to help with cough and nasal congestion. Sleep elevated on 2 pillows while cough symptoms persist. Follow-up in this clinic or with your primary care physician if you develop worsening symptoms, fever, chills, or other concerns..      ED Prescriptions     Medication Sig Dispense Auth. Provider   promethazine-dextromethorphan (PROMETHAZINE-DM) 6.25-15 MG/5ML syrup Take 5 mLs by mouth 4 (four) times daily as needed for cough. 140 mL Amaia Lavallie-Warren, Alda Lea, NP   fluticasone (FLONASE) 50 MCG/ACT nasal spray Place 2 sprays into both nostrils daily. 16 g Kataryna Mcquilkin-Warren, Alda Lea, NP      PDMP not reviewed this encounter.   Tish Men, NP 02/03/22 1148

## 2022-02-03 NOTE — ED Triage Notes (Signed)
Pt reports nasal congestion, sinus pressure, cough and chest congestion x 5 days. Zyrtec and Mucinex give so relief.

## 2022-02-03 NOTE — Discharge Instructions (Addendum)
Take medication as prescribed.  Continue Zyrtec daily as discussed. Increase fluids and allow for plenty of rest. Recommend Tylenol or ibuprofen as needed for pain, fever, or general discomfort. Recommend using a humidifier at bedtime during sleep to help with cough and nasal congestion. Sleep elevated on 2 pillows while cough symptoms persist. Follow-up in this clinic or with your primary care physician if you develop worsening symptoms, fever, chills, or other concerns.Marland Kitchen

## 2022-02-25 ENCOUNTER — Other Ambulatory Visit: Payer: Self-pay | Admitting: Family Medicine

## 2022-02-25 DIAGNOSIS — E119 Type 2 diabetes mellitus without complications: Secondary | ICD-10-CM

## 2022-03-10 ENCOUNTER — Other Ambulatory Visit: Payer: Self-pay | Admitting: Family Medicine

## 2022-03-10 DIAGNOSIS — I10 Essential (primary) hypertension: Secondary | ICD-10-CM

## 2022-03-24 LAB — HM DIABETES EYE EXAM

## 2022-04-24 ENCOUNTER — Ambulatory Visit (INDEPENDENT_AMBULATORY_CARE_PROVIDER_SITE_OTHER): Payer: Medicare HMO | Admitting: Family Medicine

## 2022-04-24 VITALS — BP 139/83 | Ht 71.0 in | Wt 240.0 lb

## 2022-04-24 DIAGNOSIS — E119 Type 2 diabetes mellitus without complications: Secondary | ICD-10-CM

## 2022-04-24 DIAGNOSIS — I10 Essential (primary) hypertension: Secondary | ICD-10-CM

## 2022-04-24 DIAGNOSIS — E782 Mixed hyperlipidemia: Secondary | ICD-10-CM | POA: Diagnosis not present

## 2022-04-24 NOTE — Patient Instructions (Signed)
A1C today.  Follow up in 6 months.  I hope things work out for you and your wife.  Dr. Lacinda Axon

## 2022-04-24 NOTE — Assessment & Plan Note (Signed)
Has been at goal on metformin.  A1c today to reassess.

## 2022-04-24 NOTE — Assessment & Plan Note (Signed)
Stable.  Will continue to monitor closely.

## 2022-04-24 NOTE — Progress Notes (Signed)
Subjective:  Patient ID: Oscar Campbell, male    DOB: 04/11/1956  Age: 66 y.o. MRN: [id number]  CC: Chief Complaint  Patient presents with   Diabetes    Follow up Going through a separation    HPI:  66 year old male with hypertension, type 2 diabetes, hyperlipidemia presents for follow-up.  Patient is currently going through some marital trouble.  He states that otherwise he is doing well.  Blood pressure is well-controlled on HCTZ and losartan.  A1c has been well-controlled on metformin.  Needs A1c today.  LDL has been fairly well-controlled without the need for medication.  Patient denies any chest pain or shortness of breath.  No other complaints concerns at this time.  Patient Active Problem List   Diagnosis Date Noted   Type 2 diabetes mellitus without complications ([place]) 76/19/5093   Mixed hyperlipidemia 12/16/2020   Kidney stones 05/15/2015   Erectile dysfunction 01/20/2013   Essential hypertension 01/20/2011    Social Hx   Social History   Socioeconomic History   Marital status: Divorced    Spouse name: Not on file   Number of children: Not on file   Years of education: Not on file   Highest education level: Not on file  Occupational History   Not on file  Tobacco Use   Smoking status: Never   Smokeless tobacco: Never  Substance and Sexual Activity   Alcohol use: No    Alcohol/week: 0.0 standard drinks of alcohol   Drug use: No   Sexual activity: Yes  Other Topics Concern   Not on file  Social History Narrative   Not on file   Social Determinants of Health   Financial Resource Strain: Not on file  Food Insecurity: Not on file  Transportation Needs: Not on file  Physical Activity: Not on file  Stress: Not on file  Social Connections: Not on file    Review of Systems Per HPI  Objective:  BP 139/83   Ht '5\' 11"'$  (1.803 m)   Wt 240 lb (108.9 kg)   BMI 33.47 kg/m      04/24/2022    9:50 AM 02/03/2022   11:24 AM 10/22/2021   10:04 AM   BP/Weight  Systolic BP 267 124 580  Diastolic BP 83 96 84  Wt. (Lbs) 240  251  BMI 33.47 kg/m2  35.01 kg/m2    Physical Exam Vitals and nursing note reviewed.  Constitutional:      General: He is not in acute distress.    Appearance: Normal appearance. He is obese.  HENT:     Head: Normocephalic and atraumatic.  Eyes:     General:        Right eye: No discharge.        Left eye: No discharge.     Conjunctiva/sclera: Conjunctivae normal.  Cardiovascular:     Rate and Rhythm: Normal rate and regular rhythm.  Pulmonary:     Effort: Pulmonary effort is normal.     Breath sounds: Normal breath sounds. No wheezing, rhonchi or rales.  Neurological:     Mental Status: He is alert.  Psychiatric:        Mood and Affect: Mood normal.        Behavior: Behavior normal.     Lab Results  Component Value Date   WBC 9.2 10/22/2021   HGB 15.8 10/22/2021   HCT 44.8 10/22/2021   PLT 189 10/22/2021   GLUCOSE 167 (H) 10/22/2021   CHOL 150 10/22/2021  TRIG 159 (H) 10/22/2021   HDL 39 (L) 10/22/2021   LDLCALC 83 10/22/2021   ALT 44 10/22/2021   AST 39 10/22/2021   NA 139 10/22/2021   K 4.0 10/22/2021   CL 101 10/22/2021   CREATININE 0.86 10/22/2021   BUN 9 10/22/2021   CO2 24 10/22/2021   PSA 1.03 12/15/2013   HGBA1C 6.8 (H) 10/22/2021     Assessment & Plan:   Problem List Items Addressed This Visit       Cardiovascular and Mediastinum   Essential hypertension    Stable.  Continue losartan and HCTZ.        Endocrine   Type 2 diabetes mellitus without complications ([place]) - Primary    Has been at goal on metformin.  A1c today to reassess.      Relevant Orders   Hemoglobin A1c     Other   Mixed hyperlipidemia    Stable.  Will continue to monitor closely.       Follow-up:  Return in about 6 months (around 10/23/2022).  [place]

## 2022-04-24 NOTE — Assessment & Plan Note (Signed)
Stable.  Continue losartan and HCTZ.

## 2022-04-25 ENCOUNTER — Telehealth: Payer: Self-pay

## 2022-04-25 LAB — HEMOGLOBIN A1C
Est. average glucose Bld gHb Est-mCnc: 143 mg/dL
Hgb A1c MFr Bld: 6.6 % — ABNORMAL HIGH (ref 4.8–5.6)

## 2022-04-25 NOTE — Telephone Encounter (Signed)
Patient has been made aware per drs notes and recommendations. 

## 2022-05-02 ENCOUNTER — Encounter: Payer: Self-pay | Admitting: *Deleted

## 2022-05-12 ENCOUNTER — Encounter: Payer: Self-pay | Admitting: *Deleted

## 2022-05-27 ENCOUNTER — Other Ambulatory Visit: Payer: Self-pay | Admitting: Family Medicine

## 2022-05-27 DIAGNOSIS — E119 Type 2 diabetes mellitus without complications: Secondary | ICD-10-CM

## 2022-05-27 DIAGNOSIS — I10 Essential (primary) hypertension: Secondary | ICD-10-CM

## 2022-07-15 ENCOUNTER — Telehealth: Payer: Self-pay | Admitting: Family Medicine

## 2022-07-15 ENCOUNTER — Other Ambulatory Visit: Payer: Self-pay | Admitting: Family Medicine

## 2022-07-15 MED ORDER — MOXIFLOXACIN HCL 0.5 % OP SOLN: 1.0000 [drp] | Freq: Three times a day (TID) | OPHTHALMIC | 0 refills | Status: AC

## 2022-07-15 NOTE — Telephone Encounter (Signed)
Left message to return call; also sent my chart message

## 2022-07-15 NOTE — Telephone Encounter (Signed)
Patient notified

## 2022-07-15 NOTE — Telephone Encounter (Signed)
Pt woke up with red crusty eyes. Pt daughter told him it looked to be conjunctivitis. Pt states it is itchy and watery. Pt has appt for Thursday for sinus infection. Pt wanting to know if we can send in eye drops for pink eye. Please advise. Thank you  [facility].

## 2022-07-17 ENCOUNTER — Ambulatory Visit (INDEPENDENT_AMBULATORY_CARE_PROVIDER_SITE_OTHER): Payer: Medicare HMO | Admitting: Family Medicine

## 2022-07-17 VITALS — BP 131/80 | HR 76 | Temp 98.0°F | Ht 71.0 in | Wt 230.0 lb

## 2022-07-17 DIAGNOSIS — J988 Other specified respiratory disorders: Secondary | ICD-10-CM | POA: Diagnosis not present

## 2022-07-17 MED ORDER — IPRATROPIUM BROMIDE 0.06 % NA SOLN: 2.0000 | Freq: Four times a day (QID) | NASAL | 0 refills | Status: DC | PRN

## 2022-07-17 MED ORDER — PROMETHAZINE-DM 6.25-15 MG/5ML PO SYRP: 5.0000 mL | ORAL_SOLUTION | Freq: Four times a day (QID) | ORAL | 0 refills | Status: DC | PRN

## 2022-07-17 NOTE — Patient Instructions (Signed)
Rest. Fluids.  Medications as prescribed.  This is likely viral.  If symptoms persist, please let me know.  Take care  Dr. Lacinda Axon

## 2022-07-17 NOTE — Progress Notes (Signed)
Subjective:  Patient ID: Oscar Campbell, male    DOB: 1956/02/26  Age: 67 y.o. MRN: [id number]  CC: Chief Complaint  Patient presents with   Sinus Problem    Watery eyes, cough at night, no fever no testing x 2 days Taking zyrtec and tylenol    HPI:  67 year old male presents for evaluation of the above.  Patient reports that he has been sick for the past 2 days.  Reports cough, sore throat, congestion.  Reports postnasal drip as well.  No documented fever.  No reported sick contacts.  He has been using over-the-counter Zyrtec and Tylenol without relief.  He is also had some drainage from his eyes.  Patient Active Problem List   Diagnosis Date Noted   Respiratory infection 07/17/2022   Type 2 diabetes mellitus without complications ([place]) [id number]   Mixed hyperlipidemia 12/16/2020   Kidney stones 05/15/2015   Erectile dysfunction 01/20/2013   Essential hypertension 01/20/2011    Social Hx   Social History   Socioeconomic History   Marital status: Divorced    Spouse name: Not on file   Number of children: Not on file   Years of education: Not on file   Highest education level: Not on file  Occupational History   Not on file  Tobacco Use   Smoking status: Never   Smokeless tobacco: Never  Substance and Sexual Activity   Alcohol use: No    Alcohol/week: 0.0 standard drinks of alcohol   Drug use: No   Sexual activity: Yes  Other Topics Concern   Not on file  Social History Narrative   Not on file   Social Determinants of Health   Financial Resource Strain: Not on file  Food Insecurity: Not on file  Transportation Needs: Not on file  Physical Activity: Not on file  Stress: Not on file  Social Connections: Not on file    Review of Systems Per HPI  Objective:  BP 131/80   Pulse 76   Temp 98 F (36.7 C)   Ht 5' 11"$  (1.803 m)   Wt 230 lb (104.3 kg)   SpO2 96%   BMI 32.08 kg/m      07/17/2022   10:45 AM 04/24/2022    9:50 AM 02/03/2022   11:24 AM   BP/Weight  Systolic BP [id number]  Diastolic BP 80 83 96  Wt. (Lbs) 230 240   BMI 32.08 kg/m2 33.47 kg/m2     Physical Exam Vitals and nursing note reviewed.  Constitutional:      General: He is not in acute distress.    Appearance: Normal appearance.  HENT:     Head: Normocephalic and atraumatic.     Nose: No rhinorrhea.     Mouth/Throat:     Pharynx: Oropharynx is clear.  Eyes:     General:        Right eye: No discharge.        Left eye: No discharge.     Conjunctiva/sclera: Conjunctivae normal.  Cardiovascular:     Rate and Rhythm: Normal rate and regular rhythm.     Heart sounds: Murmur heard.  Pulmonary:     Effort: Pulmonary effort is normal.     Breath sounds: Normal breath sounds. No wheezing, rhonchi or rales.  Neurological:     Mental Status: He is alert.     Lab Results  Component Value Date   WBC 9.2 10/22/2021   HGB 15.8 10/22/2021   HCT 44.8 10/22/2021  PLT 189 10/22/2021   GLUCOSE 167 (H) 10/22/2021   CHOL 150 10/22/2021   TRIG 159 (H) 10/22/2021   HDL 39 (L) 10/22/2021   LDLCALC 83 10/22/2021   ALT 44 10/22/2021   AST 39 10/22/2021   NA 139 10/22/2021   K 4.0 10/22/2021   CL 101 10/22/2021   CREATININE 0.86 10/22/2021   BUN 9 10/22/2021   CO2 24 10/22/2021   PSA 1.03 12/15/2013   HGBA1C 6.6 (H) 04/24/2022     Assessment & Plan:   Problem List Items Addressed This Visit       Respiratory   Respiratory infection - Primary    Likely viral.  Awaiting COVID/flu/RSV swab.  Promethazine DM for cough.  Atrovent nasal spray for congestion and postnasal drip.      Relevant Orders   COVID-19, Flu A+B and RSV    Meds ordered this encounter  Medications   promethazine-dextromethorphan (PROMETHAZINE-DM) 6.25-15 MG/5ML syrup    Sig: Take 5 mLs by mouth 4 (four) times daily as needed.    Dispense:  118 mL    Refill:  0   ipratropium (ATROVENT) 0.06 % nasal spray    Sig: Place 2 sprays into both nostrils 4 (four) times daily as needed  for rhinitis.    Dispense:  15 mL    Refill:  0    Follow-up:  Return if symptoms worsen or fail to improve.  [place]

## 2022-07-17 NOTE — Assessment & Plan Note (Signed)
Likely viral.  Awaiting COVID/flu/RSV swab.  Promethazine DM for cough.  Atrovent nasal spray for congestion and postnasal drip.

## 2022-07-18 LAB — COVID-19, FLU A+B AND RSV
Influenza A, NAA: NOT DETECTED
Influenza B, NAA: NOT DETECTED
RSV, NAA: NOT DETECTED
SARS-CoV-2, NAA: NOT DETECTED

## 2022-08-04 ENCOUNTER — Ambulatory Visit (INDEPENDENT_AMBULATORY_CARE_PROVIDER_SITE_OTHER): Payer: Medicare HMO | Admitting: Family Medicine

## 2022-08-04 VITALS — BP 137/86 | HR 79 | Temp 98.1°F | Ht 71.0 in | Wt 231.0 lb

## 2022-08-04 DIAGNOSIS — Z0001 Encounter for general adult medical examination with abnormal findings: Secondary | ICD-10-CM

## 2022-08-04 DIAGNOSIS — E782 Mixed hyperlipidemia: Secondary | ICD-10-CM | POA: Diagnosis not present

## 2022-08-04 DIAGNOSIS — Z130 Encounter for screening for diseases of the blood and blood-forming organs and certain disorders involving the immune mechanism: Secondary | ICD-10-CM

## 2022-08-04 DIAGNOSIS — Z0000 Encounter for general adult medical examination without abnormal findings: Secondary | ICD-10-CM

## 2022-08-04 DIAGNOSIS — E119 Type 2 diabetes mellitus without complications: Secondary | ICD-10-CM

## 2022-08-04 NOTE — Progress Notes (Addendum)
Subjective:   Oscar Campbell is a 67 y.o. male who presents for a Welcome to Medicare exam.   Patient states that he is doing well.  He has no complaints or concerns at this time.  No reports of chest pain or shortness of breath.  No abdominal pain.  No difficulties with his vision or his hearing.  No reports of cognitive or memory issues.  Review of Systems: See above       Objective:    Today's Vitals   08/04/22 1006  BP: 137/86  Pulse: 79  Temp: 98.1 F (36.7 C)  SpO2: 96%  Weight: 231 lb (104.8 kg)  Height: '5\' 11"'$  (1.803 m)   Body mass index is 32.22 kg/m.  Medications Outpatient Encounter Medications as of 08/04/2022  Medication Sig   aspirin EC 81 MG tablet Take 81 mg by mouth daily.   hydrochlorothiazide (HYDRODIURIL) 25 MG tablet Take 1/2 (one-half) tablet by mouth once daily   ipratropium (ATROVENT) 0.06 % nasal spray Place 2 sprays into both nostrils 4 (four) times daily as needed for rhinitis.   losartan (COZAAR) 100 MG tablet Take 1 tablet by mouth once daily   metFORMIN (GLUCOPHAGE-XR) 500 MG 24 hr tablet Take 1 tablet by mouth once daily with breakfast   [DISCONTINUED] promethazine-dextromethorphan (PROMETHAZINE-DM) 6.25-15 MG/5ML syrup Take 5 mLs by mouth 4 (four) times daily as needed.   No facility-administered encounter medications on file as of 08/04/2022.     History: Past Medical History:  Diagnosis Date   Complication of anesthesia    woke up during 1st colonscopy   Elevated PSA    Erectile dysfunction    Hypertension    Impaired fasting glucose    Kidney stones    Obesity    Seasonal rhinitis    Past Surgical History:  Procedure Laterality Date   CARDIOVASCULAR STRESS TEST     COLONOSCOPY  2009   Dr. Gala Romney: tubular adenoma   COLONOSCOPY N/A 03/02/2015   Procedure: COLONOSCOPY;  Surgeon: Daneil Dolin, MD;  [facility];  Service: Endoscopy;  Laterality: N/A;  0830   CYSTOSCOPY WITH STENT PLACEMENT Right 05/15/2015    Procedure: CYSTOSCOPY WITH EXTERNALIZED STENT PLACEMENT;  Surgeon: Cleon Gustin, MD;  Location: [facility];  Service: Urology;  Laterality: Right;   HOLMIUM LASER APPLICATION Right [id number]   Procedure: HOLMIUM LASER APPLICATION;  Surgeon: Cleon Gustin, MD;  Location: [facility];  Service: Urology;  Laterality: Right;   NEPHROLITHOTOMY Right 05/15/2015   Procedure: NEPHROLITHOTOMY PERCUTANEOUS WITH RIGHT NEPHROSTOMY TUBE PLACEMENT, SURGEON TO OBTAIN ACCESS;  Surgeon: Cleon Gustin, MD;  Location: [facility];  Service: Urology;  Laterality: Right;    Family History  Problem Relation Age of Onset   Heart disease Father    Colon cancer Neg Hx    Social History   Occupational History   Not on file  Tobacco Use   Smoking status: Never   Smokeless tobacco: Never  Substance and Sexual Activity   Alcohol use: No    Alcohol/week: 0.0 standard drinks of alcohol   Drug use: No   Sexual activity: Yes   Tobacco Counseling Non smoker.  Immunizations and Health Maintenance Immunization History  Administered Date(s) Administered   Influenza,inj,Quad PF,6+ Mos 02/14/2019, 02/15/2020   Influenza-Unspecified 02/23/2018   Moderna Sars-Covid-2 Vaccination 08/18/2019, 09/21/2019   Health Maintenance Due  Topic Date Due   DTaP/Tdap/Td (1 - Tdap) Never done    Activities of Daily Living - No  limitations to activities of daily living.      Physical Exam  Gen.: Well-appearing, no acute distress. HENT: Normocephalic atraumatic.  Eyes: No scleral icterus. Normal conjunctiva. Neck: Supple. No thyromegaly or adenopathy. Heart: Regular rate and rhythm.  No lower extremity edema. Lungs: Clear auscultation bilaterally. No rales, rhonchi, or wheezing. Abdomen: Soft, nontender, nondistended. No palpable organomegaly. No rebound or guarding. MSK: Normal range of motion. Neuro: No focal deficits. Psych: Normal mood and affect.  Advanced Directives: Desires information on advance  directives      Assessment:    This is a routine wellness  examination for this patient .  Vision/Hearing screen Has had a recent eye exam.  No hearing difficulties.  Dietary issues and exercise activities discussed:   Healthy diet with focus on whole foods.   Goals   None     Depression Screen    08/04/2022   10:26 AM 07/17/2022   10:58 AM 04/24/2022    9:57 AM 06/04/2021    4:21 PM  PHQ 2/9 Scores  PHQ - 2 Score 0 1 1 0  PHQ- 9 Score 0 3 5      Fall Risk    07/17/2022   10:58 AM  [place] in the past year? 0  Number falls in past yr: 0  Injury with Fall? 0  Risk for fall due to : No Fall Risks  Follow up Falls evaluation completed    Cognitive Function: Normal cognitive function.        Patient Care Team: Coral Spikes, DO as PCP - General (Family Medicine)     Plan:     I have personally reviewed and noted the following in the patient's chart:   Medical and social history Use of alcohol, tobacco or illicit drugs  Current medications and supplements Functional ability and status Nutritional status Physical activity Advanced directives List of other physicians Hospitalizations, surgeries, and ER visits in previous 12 months Vitals Screenings to include cognitive, depression, and falls Referrals and appointments  In addition, I have reviewed and discussed with patient certain preventive protocols, quality metrics, and best practice recommendations. A written personalized care plan for preventive services as well as general preventive health recommendations were provided to patient.    Coral Spikes, DO 08/04/2022

## 2022-08-04 NOTE — Patient Instructions (Signed)
Labs today.   Follow up in 6 months.   Take care  Dr. Keison Glendinning  

## 2022-08-05 LAB — CMP14+EGFR
ALT: 24 IU/L (ref 0–44)
AST: 25 IU/L (ref 0–40)
Albumin/Globulin Ratio: 1.4 (ref 1.2–2.2)
Albumin: 4.2 g/dL (ref 3.9–4.9)
Alkaline Phosphatase: 76 IU/L (ref 44–121)
BUN/Creatinine Ratio: 11 (ref 10–24)
BUN: 10 mg/dL (ref 8–27)
Bilirubin Total: 0.5 mg/dL (ref 0.0–1.2)
CO2: 24 mmol/L (ref 20–29)
Calcium: 9.4 mg/dL (ref 8.6–10.2)
Chloride: 100 mmol/L (ref 96–106)
Creatinine, Ser: 0.92 mg/dL (ref 0.76–1.27)
Globulin, Total: 3.1 g/dL (ref 1.5–4.5)
Glucose: 201 mg/dL — ABNORMAL HIGH (ref 70–99)
Potassium: 3.5 mmol/L (ref 3.5–5.2)
Sodium: 140 mmol/L (ref 134–144)
Total Protein: 7.3 g/dL (ref 6.0–8.5)
eGFR: 92 mL/min/{1.73_m2} (ref >59)

## 2022-08-05 LAB — MICROALBUMIN / CREATININE URINE RATIO
Creatinine, Urine: 122.2 mg/dL
Microalb/Creat Ratio: 16 mg/g creat (ref 0–29)
Microalbumin, Urine: 20 ug/mL

## 2022-08-05 LAB — CBC
Hematocrit: 46 % (ref 37.5–51.0)
Hemoglobin: 15.2 g/dL (ref 13.0–17.7)
MCH: 30.5 pg (ref 26.6–33.0)
MCHC: 33 g/dL (ref 31.5–35.7)
MCV: 92 fL (ref 79–97)
Platelets: 225 10*3/uL (ref 150–450)
RBC: 4.98 x10E6/uL (ref 4.14–5.80)
RDW: 12.9 % (ref 11.6–15.4)
WBC: 9.2 10*3/uL (ref 3.4–10.8)

## 2022-08-05 LAB — LIPID PANEL
Chol/HDL Ratio: 5 ratio (ref 0.0–5.0)
Cholesterol, Total: 174 mg/dL (ref 100–199)
HDL: 35 mg/dL — ABNORMAL LOW (ref >39)
LDL Chol Calc (NIH): 91 mg/dL (ref 0–99)
Triglycerides: 286 mg/dL — ABNORMAL HIGH (ref 0–149)
VLDL Cholesterol Cal: 48 mg/dL — ABNORMAL HIGH (ref 5–40)

## 2022-08-05 LAB — HEMOGLOBIN A1C
Est. average glucose Bld gHb Est-mCnc: 123 mg/dL
Hgb A1c MFr Bld: 5.9 % — ABNORMAL HIGH (ref 4.8–5.6)

## 2022-08-13 ENCOUNTER — Other Ambulatory Visit: Payer: Self-pay

## 2022-08-13 ENCOUNTER — Other Ambulatory Visit: Payer: 59

## 2022-08-13 DIAGNOSIS — R972 Elevated prostate specific antigen [PSA]: Secondary | ICD-10-CM

## 2022-08-13 DIAGNOSIS — N200 Calculus of kidney: Secondary | ICD-10-CM

## 2022-08-14 LAB — PSA: Prostate Specific Ag, Serum: 1.3 ng/mL (ref 0.0–4.0)

## 2022-08-18 ENCOUNTER — Ambulatory Visit (HOSPITAL_COMMUNITY)
Admission: RE | Admit: 2022-08-18 | Discharge: 2022-08-18 | Disposition: A | Discharge status: Home / Self Care | Payer: Medicare HMO | Source: Ambulatory Visit | Attending: Urology | Admitting: Urology

## 2022-08-18 DIAGNOSIS — N200 Calculus of kidney: Secondary | ICD-10-CM | POA: Diagnosis not present

## 2022-08-20 ENCOUNTER — Ambulatory Visit: Payer: Medicare HMO | Admitting: Urology

## 2022-08-20 VITALS — BP 135/79 | HR 76

## 2022-08-20 DIAGNOSIS — N200 Calculus of kidney: Secondary | ICD-10-CM | POA: Diagnosis not present

## 2022-08-20 DIAGNOSIS — N3000 Acute cystitis without hematuria: Secondary | ICD-10-CM

## 2022-08-20 DIAGNOSIS — R972 Elevated prostate specific antigen [PSA]: Secondary | ICD-10-CM | POA: Diagnosis not present

## 2022-08-20 LAB — MICROSCOPIC EXAMINATION: WBC, UA: >30 /hpf — AB (ref 0–5)

## 2022-08-20 LAB — URINALYSIS, ROUTINE W REFLEX MICROSCOPIC
Bilirubin, UA: NEGATIVE
Glucose, UA: NEGATIVE
Ketones, UA: NEGATIVE
Nitrite, UA: POSITIVE — AB
Specific Gravity, UA: 1.02 (ref 1.005–1.030)
Urobilinogen, Ur: 1 mg/dL (ref 0.2–1.0)
pH, UA: 6 (ref 5.0–7.5)

## 2022-08-20 NOTE — Progress Notes (Unsigned)
08/20/2022 10:39 AM   Oscar Campbell 11/06/1955 [id number]  Referring provider: Coral Spikes, DO [place],  [place] [id number]  No chief complaint on file.   HPI:    PMH: Past Medical History:  Diagnosis Date   Complication of anesthesia    woke up during 1st colonscopy   Elevated PSA    Erectile dysfunction    Hypertension    Impaired fasting glucose    Kidney stones    Obesity    Seasonal rhinitis     Surgical History: Past Surgical History:  Procedure Laterality Date   CARDIOVASCULAR STRESS TEST     COLONOSCOPY  2009   Dr. Gala Romney: tubular adenoma   COLONOSCOPY N/A 03/02/2015   Procedure: COLONOSCOPY;  Surgeon: Daneil Dolin, MD;  [facility];  Service: Endoscopy;  Laterality: N/A;  0830   CYSTOSCOPY WITH STENT PLACEMENT Right 05/15/2015   Procedure: CYSTOSCOPY WITH EXTERNALIZED STENT PLACEMENT;  Surgeon: Cleon Gustin, MD;  Location[facility];  Service: Urology;  Laterality: Right;   HOLMIUM LASER APPLICATION Right [id number]   Procedure: HOLMIUM LASER APPLICATION;  Surgeon: Cleon Gustin, MD;  [facility];  Service: Urology;  Laterality: Right;   NEPHROLITHOTOMY Right 05/15/2015   Procedure: NEPHROLITHOTOMY PERCUTANEOUS WITH RIGHT NEPHROSTOMY TUBE PLACEMENT, SURGEON TO OBTAIN ACCESS;  Surgeon: Cleon Gustin, MD;  Location[facility];  Service: Urology;  Laterality: Right;    Home Medications:  Allergies as of 08/20/2022       Reactions   Penicillins Rash   Has patient had a PCN reaction causing immediate rash, facial/tongue/throat swelling, SOB or lightheadedness with hypotension: unkown Has patient had a PCN reaction causing severe rash involving mucus membranes or skin necrosis: no Has patient had a PCN reaction that required hospitalization no Has patient had a PCN reaction occurring within the last 10 years: no If all of the above answers are "NO", then may proceed with Cephalosporin use.        Medication  List        Accurate as of August 20, 2022 10:39 AM. If you have any questions, ask your nurse or doctor.          aspirin EC 81 MG tablet Take 81 mg by mouth daily.   hydrochlorothiazide 25 MG tablet Commonly known as: HYDRODIURIL Take 1/2 (one-half) tablet by mouth once daily   ipratropium 0.06 % nasal spray Commonly known as: ATROVENT Place 2 sprays into both nostrils 4 (four) times daily as needed for rhinitis.   losartan 100 MG tablet Commonly known as: COZAAR Take 1 tablet by mouth once daily   metFORMIN 500 MG 24 hr tablet Commonly known as: GLUCOPHAGE-XR Take 1 tablet by mouth once daily with breakfast        Allergies:  Allergies  Allergen Reactions   Penicillins Rash    Has patient had a PCN reaction causing immediate rash, facial/tongue/throat swelling, SOB or lightheadedness with hypotension: unkown Has patient had a PCN reaction causing severe rash involving mucus membranes or skin necrosis: no Has patient had a PCN reaction that required hospitalization no Has patient had a PCN reaction occurring within the last 10 years: no If all of the above answers are "NO", then may proceed with Cephalosporin use.    Family History: Family History  Problem Relation Age of Onset   Heart disease Father    Colon cancer Neg Hx     Social History:  reports that  he has never smoked. He has never used smokeless tobacco. He reports that he does not drink alcohol and does not use drugs.  ROS: All other review of systems were reviewed and are negative except what is noted above in HPI  Physical Exam: BP 135/79   Pulse 76   Constitutional:  Alert and oriented, No acute distress. HEENT: [place] AT, moist mucus membranes.  Trachea midline, no masses. Cardiovascular: No clubbing, cyanosis, or edema. Respiratory: Normal respiratory effort, no increased work of breathing. GI: Abdomen is soft, nontender, nondistended, no abdominal masses GU: No CVA tenderness.  Lymph: No  cervical or inguinal lymphadenopathy. Skin: No rashes, bruises or suspicious lesions. Neurologic: Grossly intact, no focal deficits, moving all 4 extremities. Psychiatric: Normal mood and affect.  Laboratory Data: Lab Results  Component Value Date   WBC 9.2 08/04/2022   HGB 15.2 08/04/2022   HCT 46.0 08/04/2022   MCV 92 08/04/2022   PLT 225 08/04/2022    Lab Results  Component Value Date   CREATININE 0.92 08/04/2022    Lab Results  Component Value Date   PSA 1.03 12/15/2013   PSA 0.91 01/22/2013    No results found for: "TESTOSTERONE"  Lab Results  Component Value Date   HGBA1C 5.9 (H) 08/04/2022    Urinalysis    Component Value Date/Time   COLORURINE YELLOW 04/23/2015 0520   APPEARANCEUR Cloudy (A) 08/21/2021 1044   LABSPEC >1.030 (H) 04/23/2015 0520   PHURINE 6.0 04/23/2015 0520   GLUCOSEU Trace (A) 08/21/2021 1044   HGBUR MODERATE (A) 04/23/2015 0520   BILIRUBINUR Negative 08/21/2021 1044   KETONESUR NEGATIVE 04/23/2015 0520   PROTEINUR Negative 08/21/2021 1044   PROTEINUR 100 (A) 04/23/2015 0520   NITRITE Positive (A) 08/21/2021 1044   NITRITE NEGATIVE 04/23/2015 0520   LEUKOCYTESUR 2+ (A) 08/21/2021 1044    Lab Results  Component Value Date   LABMICR 20.0 08/04/2022   WBCUA 11-30 (A) 08/21/2021   LABEPIT 0-10 08/21/2021   BACTERIA Moderate (A) 08/21/2021    Pertinent Imaging: *** Results for orders placed in visit on 08/13/22  DG Abd 1 View  Narrative CLINICAL DATA:  Kidney stone  EXAM: ABDOMEN - 1 VIEW  COMPARISON:  08/21/2021  FINDINGS: Unremarkable bowel gas pattern. Bilateral 3-5 mm pelvic calcifications consistent with stones or phleboliths.  IMPRESSION: Pelvic calcifications consistent with stones or phleboliths. This distinction could be made with a CT if indicated.   Electronically Signed By: Sammie Bench M.D. On: 08/19/2022 13:46  No results found for this or any previous visit.  No results found for this or any  previous visit.  No results found for this or any previous visit.  Results for orders placed during the hospital encounter of 03/27/17  US RENAL  Narrative CLINICAL DATA:  Follow-up renal calculus and left-sided cyst.  EXAM: RENAL / URINARY TRACT ULTRASOUND COMPLETE  COMPARISON:  March 17, 2016  FINDINGS: Right Kidney:  Length: 12.7 cm. There is a 9 mm shadowing calculus in the lower pole of the right kidney, unchanged in the interval with no evidence of hydronephrosis/obstruction.  Left Kidney:  Length: 12.7 cm. There is a 2 x 1.4 x 1.2 cm cyst in the upper pole of the left kidney versus 1.4 x 1.2 x 1.1 cm on the previous study.  Bladder:  Appears normal for degree of bladder distention.  IMPRESSION: 1. Nonshadowing stone in the lower pole of the right kidney, unchanged. 2. The cyst in the upper pole of the left kidney  is a little larger in the interval now measuring up to 2 cm.   Electronically Signed By: Dorise Bullion III M.D On: 03/28/2017 07:15  No valid procedures specified. No results found for this or any previous visit.  Results for orders placed in visit on 02/06/20  CT RENAL STONE STUDY  Narrative CLINICAL DATA:  Bilateral flank pain for 1 week.  EXAM: CT ABDOMEN AND PELVIS WITHOUT CONTRAST  TECHNIQUE: Multidetector CT imaging of the abdomen and pelvis was performed following the standard protocol without IV contrast.  COMPARISON:  04/23/2015  FINDINGS: Lower chest: No acute pulmonary findings. No pleural or pericardial effusion. 3.5 mm nodule in the right middle lobe is unchanged since 2016 and considered benign.  Hepatobiliary: No hepatic lesions are identified without contrast. No intrahepatic biliary dilatation. The gallbladder appears normal. No common bile duct dilatation.  Pancreas: No mass, inflammation or ductal dilatation.  Spleen: The spleen is upper limits of normal in size. No splenic lesions.  Adrenals/Urinary  Tract: Small left adrenal gland nodule is stable and consistent with a benign adenoma. The right adrenal gland is unremarkable.  Small lower pole right renal calculi are noted. No obstructing ureteral calculi or hydroureteronephrosis.  No left-sided renal or ureteral calculi. There is a small left renal cyst noted.  The bladder is unremarkable. No bladder calculi or obvious mass without contrast.  Stomach/Bowel: Stomach, duodenum, small bowel and colon are unremarkable. No acute inflammatory changes, mass lesions or obstructive findings. The terminal ileum is normal. The appendix is normal.  Vascular/Lymphatic: The aorta is normal in caliber. No atheroscerlotic calcifications. No mesenteric of retroperitoneal mass or adenopathy. Small scattered lymph nodes are noted.  Reproductive: The prostate gland and seminal vesicles are unremarkable.  Other: No pelvic mass or adenopathy. No free pelvic fluid collections. No inguinal mass or adenopathy. No abdominal wall hernia or subcutaneous lesions.  Musculoskeletal: No significant bony findings.  IMPRESSION: 1. Small lower pole right renal calculi but no obstructing ureteral calculi or hydroureteronephrosis. 2. No acute abdominal/pelvic findings, mass lesions or adenopathy. 3. Stable small left adrenal gland adenoma.   Electronically Signed By: Marijo Sanes M.D. On: 02/07/2020 10:43   Assessment & Plan:    1. Kidney stones Followup 1 year with [facility] - Urinalysis, Routine w reflex microscopic  2. Elevated PSA -followup 1 year with PSA   No follow-ups on file.  Nicolette Bang, MD  [facility] [place]

## 2022-08-21 ENCOUNTER — Encounter: Payer: Self-pay | Admitting: Urology

## 2022-08-21 NOTE — Patient Instructions (Signed)

## 2022-08-23 LAB — URINE CULTURE

## 2022-08-25 ENCOUNTER — Telehealth: Payer: Self-pay

## 2022-08-25 MED ORDER — NITROFURANTOIN MONOHYD MACRO 100 MG PO CAPS: 100.0000 mg | ORAL_CAPSULE | Freq: Two times a day (BID) | ORAL | 0 refills | Status: DC

## 2022-08-25 NOTE — Telephone Encounter (Signed)
Patient is aware that his urine culture was positive and Macrobid po bid x 5 days. Patient voiced understanding

## 2022-08-25 NOTE — Telephone Encounter (Signed)
Patient called with no answer. Message left to return call to office. My-chart message sent. Macrobid po bid x 5 days sent to pharmacy per Dr. Junious Silk.

## 2022-10-23 ENCOUNTER — Ambulatory Visit: Payer: Medicare HMO | Admitting: Family Medicine

## 2022-11-15 ENCOUNTER — Other Ambulatory Visit: Payer: Self-pay | Admitting: Family Medicine

## 2022-11-15 DIAGNOSIS — I10 Essential (primary) hypertension: Secondary | ICD-10-CM

## 2022-11-16 ENCOUNTER — Other Ambulatory Visit: Payer: Self-pay | Admitting: Family Medicine

## 2022-11-16 DIAGNOSIS — I10 Essential (primary) hypertension: Secondary | ICD-10-CM

## 2022-11-16 DIAGNOSIS — E119 Type 2 diabetes mellitus without complications: Secondary | ICD-10-CM

## 2023-02-02 ENCOUNTER — Telehealth: Payer: Self-pay | Admitting: Family Medicine

## 2023-02-02 NOTE — Telephone Encounter (Signed)
Patient has six month follow up on 9/11 and wanting to know if he needs blood work done.

## 2023-02-03 ENCOUNTER — Other Ambulatory Visit: Payer: Self-pay

## 2023-02-03 DIAGNOSIS — E782 Mixed hyperlipidemia: Secondary | ICD-10-CM

## 2023-02-03 DIAGNOSIS — E119 Type 2 diabetes mellitus without complications: Secondary | ICD-10-CM

## 2023-02-03 DIAGNOSIS — I10 Essential (primary) hypertension: Secondary | ICD-10-CM

## 2023-02-03 DIAGNOSIS — Z130 Encounter for screening for diseases of the blood and blood-forming organs and certain disorders involving the immune mechanism: Secondary | ICD-10-CM

## 2023-02-03 NOTE — Telephone Encounter (Signed)
Patient has been informed per labs ordered

## 2023-02-04 ENCOUNTER — Encounter: Payer: Self-pay | Admitting: Family Medicine

## 2023-02-04 ENCOUNTER — Ambulatory Visit (INDEPENDENT_AMBULATORY_CARE_PROVIDER_SITE_OTHER): Payer: Medicare HMO | Admitting: Family Medicine

## 2023-02-04 VITALS — BP 144/69 | HR 64 | Temp 97.4°F | Wt 224.8 lb

## 2023-02-04 DIAGNOSIS — I10 Essential (primary) hypertension: Secondary | ICD-10-CM

## 2023-02-04 DIAGNOSIS — E119 Type 2 diabetes mellitus without complications: Secondary | ICD-10-CM | POA: Diagnosis not present

## 2023-02-04 DIAGNOSIS — E782 Mixed hyperlipidemia: Secondary | ICD-10-CM | POA: Diagnosis not present

## 2023-02-04 MED ORDER — LOSARTAN POTASSIUM 100 MG PO TABS: 100.0000 mg | ORAL_TABLET | Freq: Every day | ORAL | 3 refills | Status: DC

## 2023-02-04 MED ORDER — HYDROCHLOROTHIAZIDE 25 MG PO TABS: ORAL_TABLET | ORAL | 3 refills | Status: DC

## 2023-02-04 NOTE — Patient Instructions (Signed)
Continue your medications.  Follow up in 3-6 months.  Take care  Dr. Lacinda Axon

## 2023-02-04 NOTE — Assessment & Plan Note (Signed)
BP slightly elevated here today.  Improved on recheck.  Will continue losartan and HCTZ.  Will continue close follow-up.  Patient has lost weight and continues to make lifestyle changes.

## 2023-02-04 NOTE — Assessment & Plan Note (Signed)
Awaiting lipid panel

## 2023-02-04 NOTE — Assessment & Plan Note (Signed)
Been well-controlled.  Awaiting A1c.  Continue metformin.

## 2023-02-04 NOTE — Progress Notes (Signed)
Subjective:  Patient ID: Oscar Campbell, male    DOB: [birth year]  Age: 67 y.o. MRN: [id number]  CC: Follow-up   HPI:  67 year old male with the below mentioned medical problems presents for follow-up.  BP mildly elevated here today.  He is on HCTZ and losartan.  Diabetes has been well-controlled.  Needs A1c.  He had his labs drawn this morning.  Compliant with metformin.  Patient not currently on any pharmacotherapy regarding hyperlipidemia.  Awaiting lipid panel.  Patient states he is feeling well.  No chest pain or shortness of breath.  Declines flu shot.  Declines vaccines today.  Needs foot exam.  Patient Active Problem List   Diagnosis Date Noted   Type 2 diabetes mellitus without complications (HCC) 12/16/2020   Mixed hyperlipidemia 12/16/2020   Kidney stones 05/15/2015   Erectile dysfunction 01/20/2013   Essential hypertension 01/20/2011    Social Hx   Social History   Socioeconomic History   Marital status: Divorced    Spouse name: Not on file   Number of children: Not on file   Years of education: Not on file   Highest education level: Not on file  Occupational History   Not on file  Tobacco Use   Smoking status: Never   Smokeless tobacco: Never  Substance and Sexual Activity   Alcohol use: No    Alcohol/week: 0.0 standard drinks of alcohol   Drug use: No   Sexual activity: Yes  Other Topics Concern   Not on file  Social History Narrative   Not on file   Social Determinants of Health   Financial Resource Strain: Not on file  Food Insecurity: Not on file  Transportation Needs: Not on file  Physical Activity: Not on file  Stress: Not on file  Social Connections: Not on file    Review of Systems Per HPI  Objective:  BP (!) 144/69   Pulse 64   Temp (!) 97.4 F (36.3 C) (Oral)   Wt 224 lb 12.8 oz (102 kg)   SpO2 98%   BMI 31.35 kg/m      02/04/2023   10:34 AM 02/04/2023   10:05 AM 08/20/2022   10:31 AM  BP/Weight  Systolic BP 144 148  135  Diastolic BP 69 87 79  Wt. (Lbs)  224.8   BMI  31.35 kg/m2     Physical Exam Vitals and nursing note reviewed.  Constitutional:      General: He is not in acute distress.    Appearance: Normal appearance.  HENT:     Head: Normocephalic and atraumatic.  Eyes:     General:        Right eye: No discharge.        Left eye: No discharge.     Conjunctiva/sclera: Conjunctivae normal.  Cardiovascular:     Rate and Rhythm: Normal rate and regular rhythm.  Pulmonary:     Effort: Pulmonary effort is normal.     Breath sounds: Normal breath sounds. No wheezing, rhonchi or rales.  Neurological:     Mental Status: He is alert.  Psychiatric:        Mood and Affect: Mood normal.        Behavior: Behavior normal.     Lab Results  Component Value Date   WBC 9.2 08/04/2022   HGB 15.2 08/04/2022   HCT 46.0 08/04/2022   PLT 225 08/04/2022   GLUCOSE 201 (H) 08/04/2022   CHOL 174 08/04/2022   TRIG 286 (H)  08/04/2022   HDL 35 (L) 08/04/2022   LDLCALC 91 08/04/2022   ALT 24 08/04/2022   AST 25 08/04/2022   NA 140 08/04/2022   K 3.5 08/04/2022   CL 100 08/04/2022   CREATININE 0.92 08/04/2022   BUN 10 08/04/2022   CO2 24 08/04/2022   PSA 1.03 12/15/2013   HGBA1C 5.9 (H) 08/04/2022     Assessment & Plan:   Problem List Items Addressed This Visit       Cardiovascular and Mediastinum   Essential hypertension - Primary    BP slightly elevated here today.  Improved on recheck.  Will continue losartan and HCTZ.  Will continue close follow-up.  Patient has lost weight and continues to make lifestyle changes.      Relevant Medications   hydrochlorothiazide (HYDRODIURIL) 25 MG tablet   losartan (COZAAR) 100 MG tablet     Endocrine   Type 2 diabetes mellitus without complications (HCC)    Been well-controlled.  Awaiting A1c.  Continue metformin.      Relevant Medications   losartan (COZAAR) 100 MG tablet     Other   Mixed hyperlipidemia    Awaiting lipid panel.       Relevant Medications   hydrochlorothiazide (HYDRODIURIL) 25 MG tablet   losartan (COZAAR) 100 MG tablet    Meds ordered this encounter  Medications   hydrochlorothiazide (HYDRODIURIL) 25 MG tablet    Sig: Take 1/2 (one-half) tablet by mouth once daily    Dispense:  45 tablet    Refill:  3   losartan (COZAAR) 100 MG tablet    Sig: Take 1 tablet (100 mg total) by mouth daily.    Dispense:  90 tablet    Refill:  3    Follow-up:  3-6 months  Matai Carpenito Adriana Simas DO [facility]

## 2023-02-05 LAB — CBC WITH DIFFERENTIAL/PLATELET
Basophils Absolute: 0.1 10*3/uL (ref 0.0–0.2)
Basos: 1 %
EOS (ABSOLUTE): 0.2 10*3/uL (ref 0.0–0.4)
Eos: 3 %
Hematocrit: 47.7 % (ref 37.5–51.0)
Hemoglobin: 15.8 g/dL (ref 13.0–17.7)
Immature Grans (Abs): 0.1 10*3/uL (ref 0.0–0.1)
Immature Granulocytes: 1 %
Lymphocytes Absolute: 2.7 10*3/uL (ref 0.7–3.1)
Lymphs: 33 %
MCH: 30.4 pg (ref 26.6–33.0)
MCHC: 33.1 g/dL (ref 31.5–35.7)
MCV: 92 fL (ref 79–97)
Monocytes Absolute: 0.5 10*3/uL (ref 0.1–0.9)
Monocytes: 6 %
Neutrophils Absolute: 4.6 10*3/uL (ref 1.4–7.0)
Neutrophils: 56 %
Platelets: 214 10*3/uL (ref 150–450)
RBC: 5.2 x10E6/uL (ref 4.14–5.80)
RDW: 13 % (ref 11.6–15.4)
WBC: 8.1 10*3/uL (ref 3.4–10.8)

## 2023-02-05 LAB — COMPREHENSIVE METABOLIC PANEL
ALT: 28 IU/L (ref 0–44)
AST: 27 IU/L (ref 0–40)
Albumin: 4.3 g/dL (ref 3.9–4.9)
Alkaline Phosphatase: 91 IU/L (ref 44–121)
BUN/Creatinine Ratio: 13 (ref 10–24)
BUN: 12 mg/dL (ref 8–27)
Bilirubin Total: 0.7 mg/dL (ref 0.0–1.2)
CO2: 24 mmol/L (ref 20–29)
Calcium: 9.3 mg/dL (ref 8.6–10.2)
Chloride: 101 mmol/L (ref 96–106)
Creatinine, Ser: 0.92 mg/dL (ref 0.76–1.27)
Globulin, Total: 3 g/dL (ref 1.5–4.5)
Glucose: 132 mg/dL — ABNORMAL HIGH (ref 70–99)
Potassium: 3.9 mmol/L (ref 3.5–5.2)
Sodium: 140 mmol/L (ref 134–144)
Total Protein: 7.3 g/dL (ref 6.0–8.5)
eGFR: 92 mL/min/{1.73_m2} (ref >59)

## 2023-02-05 LAB — HEMOGLOBIN A1C
Est. average glucose Bld gHb Est-mCnc: 123 mg/dL
Hgb A1c MFr Bld: 5.9 % — ABNORMAL HIGH (ref 4.8–5.6)

## 2023-02-05 LAB — LIPID PANEL
Chol/HDL Ratio: 4.5 ratio (ref 0.0–5.0)
Cholesterol, Total: 170 mg/dL (ref 100–199)
HDL: 38 mg/dL — ABNORMAL LOW (ref >39)
LDL Chol Calc (NIH): 100 mg/dL — ABNORMAL HIGH (ref 0–99)
Triglycerides: 185 mg/dL — ABNORMAL HIGH (ref 0–149)
VLDL Cholesterol Cal: 32 mg/dL (ref 5–40)

## 2023-02-10 ENCOUNTER — Other Ambulatory Visit: Payer: Self-pay | Admitting: Family Medicine

## 2023-02-10 MED ORDER — ROSUVASTATIN CALCIUM 10 MG PO TABS: 10.0000 mg | ORAL_TABLET | Freq: Every day | ORAL | 3 refills | Status: DC

## 2023-02-14 ENCOUNTER — Other Ambulatory Visit: Payer: Self-pay | Admitting: Family Medicine

## 2023-02-14 DIAGNOSIS — E119 Type 2 diabetes mellitus without complications: Secondary | ICD-10-CM

## 2023-03-26 LAB — HM DIABETES EYE EXAM

## 2023-07-19 ENCOUNTER — Telehealth: Payer: Medicare HMO | Admitting: Physician Assistant

## 2023-07-19 DIAGNOSIS — J329 Chronic sinusitis, unspecified: Secondary | ICD-10-CM | POA: Diagnosis not present

## 2023-07-19 MED ORDER — LEVOCETIRIZINE DIHYDROCHLORIDE 5 MG PO TABS: 5.0000 mg | ORAL_TABLET | Freq: Every evening | ORAL | 0 refills | Status: DC

## 2023-07-19 MED ORDER — AZITHROMYCIN 250 MG PO TABS: ORAL_TABLET | ORAL | 0 refills | Status: AC

## 2023-07-19 MED ORDER — FLUTICASONE PROPIONATE 50 MCG/ACT NA SUSP: 2.0000 | Freq: Every day | NASAL | 6 refills | Status: AC

## 2023-07-19 NOTE — Progress Notes (Signed)
 Virtual Visit Consent   Oscar Campbell, you are scheduled for a virtual visit with a [facility] provider today. Just as with appointments in the office, your consent must be obtained to participate. Your consent will be active for this visit and any virtual visit you may have with one of our providers in the next 365 days. If you have a MyChart account, a copy of this consent can be sent to you electronically.  As this is a virtual visit, video technology does not allow for your provider to perform a traditional examination. This may limit your provider's ability to fully assess your condition. If your provider identifies any concerns that need to be evaluated in person or the need to arrange testing (such as labs, EKG, etc.), we will make arrangements to do so. Although advances in technology are sophisticated, we cannot ensure that it will always work on either your end or our end. If the connection with a video visit is poor, the visit may have to be switched to a telephone visit. With either a video or telephone visit, we are not always able to ensure that we have a secure connection.  By engaging in this virtual visit, you consent to the provision of healthcare and authorize for your insurance to be billed (if applicable) for the services provided during this visit. Depending on your insurance coverage, you may receive a charge related to this service.  I need to obtain your verbal consent now. Are you willing to proceed with your visit today? Oscar Campbell has provided verbal consent on 07/19/2023 for a virtual visit (video or telephone). [name], [place]  Date: 07/19/2023 11:57 AM   Virtual Visit via Video Note   I, [name], connected with  Oscar Campbell  ([id number], 12/26/1955) on 07/19/23 at 11:45 AM EST by a video-enabled telemedicine application and verified that I am speaking with the correct person using two identifiers.  Location: Patient: Virtual Visit Location Patient:  Home Provider: Virtual Visit Location Provider: Home Office   I discussed the limitations of evaluation and management by telemedicine and the availability of in person appointments. The patient expressed understanding and agreed to proceed.    History of Present Illness: Oscar Campbell is a 68 y.o. who identifies as a male who was assigned male at birth, and is being seen today for sinusitis.  HPI: Sinus Problem This is a new problem. The current episode started in the past 7 days. The problem has been waxing and waning since onset. The maximum temperature recorded prior to his arrival was 100.4 - 100.9 F. The fever has been present for Less than 1 day. The pain is mild. Associated symptoms include congestion and coughing. Past treatments include oral decongestants, saline nose sprays, acetaminophen and nasal decongestants. The treatment provided moderate relief.    Problems:  Patient Active Problem List   Diagnosis Date Noted   Type 2 diabetes mellitus without complications (HCC) 12/16/2020   Mixed hyperlipidemia 12/16/2020   Kidney stones 05/15/2015   Erectile dysfunction 01/20/2013   Essential hypertension 01/20/2011    Allergies:  Allergies  Allergen Reactions   Penicillins Rash    Has patient had a PCN reaction causing immediate rash, facial/tongue/throat swelling, SOB or lightheadedness with hypotension: unkown Has patient had a PCN reaction causing severe rash involving mucus membranes or skin necrosis: no Has patient had a PCN reaction that required hospitalization no Has patient had a PCN reaction occurring within the last 10 years: no  If all of the above answers are "NO", then may proceed with Cephalosporin use.   Medications:  Current Outpatient Medications:    aspirin EC 81 MG tablet, Take 81 mg by mouth daily., Disp: , Rfl:    hydrochlorothiazide (HYDRODIURIL) 25 MG tablet, Take 1/2 (one-half) tablet by mouth once daily, Disp: 45 tablet, Rfl: 3   ipratropium (ATROVENT)  0.06 % nasal spray, Place 2 sprays into both nostrils 4 (four) times daily as needed for rhinitis., Disp: 15 mL, Rfl: 0   losartan (COZAAR) 100 MG tablet, Take 1 tablet (100 mg total) by mouth daily., Disp: 90 tablet, Rfl: 3   metFORMIN (GLUCOPHAGE-XR) 500 MG 24 hr tablet, Take 1 tablet by mouth once daily with breakfast, Disp: 90 tablet, Rfl: 1   rosuvastatin (CRESTOR) 10 MG tablet, Take 1 tablet (10 mg total) by mouth daily., Disp: 90 tablet, Rfl: 3  Observations/Objective: Patient is well-developed, well-nourished in no acute distress.  Resting comfortably  at home.  Head is normocephalic, atraumatic.  No labored breathing.  Speech is clear and coherent with logical content.  Patient is alert and oriented at baseline.    Assessment and Plan: 1. Sinusitis, unspecified chronicity, unspecified location (Primary)  Presentation consistent with bacterial sinusitis.  No evidence of other bacterial infections including pneumonia, meningitis, pharyngitis, otitis media.  Discussed that this fits picture of viral vs bacterial sinusitis and that due to type and duration of symptoms and exam findings, we will treat as bacterial sinusitis.  Antibiotics prescribed. Advised to continue ibuprofen and Tylenol at home. Patient is to follow up with primary physician if having continued symptoms.   Advised patient on supportive therapies, including using a cool-mist vaporizer/humidifier/steam from hot showers, OTC throat lozenges, advancement of fluids as tolerated, nasal saline sprays, rest, OTC acetaminophen or ibuprofen for pain control, frequent handwashing.  Follow Up Instructions: I discussed the assessment and treatment plan with the patient. The patient was provided an opportunity to ask questions and all were answered. The patient agreed with the plan and demonstrated an understanding of the instructions.  A copy of instructions were sent to the patient via MyChart unless otherwise noted below.     The patient was advised to call back or seek an in-person evaluation if the symptoms worsen or if the condition fails to improve as anticipated.    [name], PA-C

## 2023-07-19 NOTE — Patient Instructions (Signed)
  Oscar Campbell, thank you for joining [name], PA-C for today's virtual visit.  While this provider is not your primary care provider (PCP), if your PCP is located in our provider database this encounter information will be shared with them immediately following your visit.   [name] gives you access to today's visit and all your visits, tests, and labs performed at [facility] " click here if you don't have a [name] or go to [email or web address]  Consent: (Patient) Oscar Campbell provided verbal consent for this virtual visit at the beginning of the encounter.  Current Medications:  Current Outpatient Medications:    aspirin EC 81 MG tablet, Take 81 mg by mouth daily., Disp: , Rfl:    hydrochlorothiazide (HYDRODIURIL) 25 MG tablet, Take 1/2 (one-half) tablet by mouth once daily, Disp: 45 tablet, Rfl: 3   ipratropium (ATROVENT) 0.06 % nasal spray, Place 2 sprays into both nostrils 4 (four) times daily as needed for rhinitis., Disp: 15 mL, Rfl: 0   losartan (COZAAR) 100 MG tablet, Take 1 tablet (100 mg total) by mouth daily., Disp: 90 tablet, Rfl: 3   metFORMIN (GLUCOPHAGE-XR) 500 MG 24 hr tablet, Take 1 tablet by mouth once daily with breakfast, Disp: 90 tablet, Rfl: 1   rosuvastatin (CRESTOR) 10 MG tablet, Take 1 tablet (10 mg total) by mouth daily., Disp: 90 tablet, Rfl: 3   Medications ordered in this encounter:  No orders of the defined types were placed in this encounter.    *If you need refills on other medications prior to your next appointment, please contact your pharmacy*  Follow-Up: Call back or seek an in-person evaluation if the symptoms worsen or if the condition fails to improve as anticipated.  [name] [phone]  Other Instructions Please report to the nearest Emergency room with any worsening symptoms. Follow up with primary care provider (PCP) in 2 -3 days.    If you have been  instructed to have an in-person evaluation today at a local Urgent Care facility, please use the link below. It will take you to a list of all of our available [facility], including address, phone number and hours of operation. Please do not delay care.  [name]  If you or a family member do not have a primary care provider, use the link below to schedule a visit and establish care. When you choose a [facility] primary care physician or advanced practice provider, you gain a long-term partner in health. Find a Primary Care Provider  Learn more about [facility] in-office and virtual care options: [name] - Get Care Now

## 2023-08-04 ENCOUNTER — Encounter: Payer: Self-pay | Admitting: Family Medicine

## 2023-08-04 ENCOUNTER — Ambulatory Visit (INDEPENDENT_AMBULATORY_CARE_PROVIDER_SITE_OTHER): Payer: Medicare HMO | Admitting: Family Medicine

## 2023-08-04 VITALS — BP 118/68 | HR 77 | Temp 98.1°F | Ht 71.0 in | Wt 216.0 lb

## 2023-08-04 DIAGNOSIS — Z7984 Long term (current) use of oral hypoglycemic drugs: Secondary | ICD-10-CM

## 2023-08-04 DIAGNOSIS — E1169 Type 2 diabetes mellitus with other specified complication: Secondary | ICD-10-CM

## 2023-08-04 DIAGNOSIS — Z125 Encounter for screening for malignant neoplasm of prostate: Secondary | ICD-10-CM

## 2023-08-04 DIAGNOSIS — I10 Essential (primary) hypertension: Secondary | ICD-10-CM

## 2023-08-04 DIAGNOSIS — E119 Type 2 diabetes mellitus without complications: Secondary | ICD-10-CM

## 2023-08-04 DIAGNOSIS — Z130 Encounter for screening for diseases of the blood and blood-forming organs and certain disorders involving the immune mechanism: Secondary | ICD-10-CM

## 2023-08-04 DIAGNOSIS — E782 Mixed hyperlipidemia: Secondary | ICD-10-CM

## 2023-08-04 MED ORDER — HYDROCHLOROTHIAZIDE 25 MG PO TABS: ORAL_TABLET | ORAL | 3 refills | Status: AC

## 2023-08-04 MED ORDER — LOSARTAN POTASSIUM 100 MG PO TABS: 100.0000 mg | ORAL_TABLET | Freq: Every day | ORAL | 3 refills | Status: AC

## 2023-08-04 NOTE — Assessment & Plan Note (Signed)
Stable.  Continue current medications.  Medications refilled.

## 2023-08-04 NOTE — Progress Notes (Signed)
 Subjective:  Patient ID: Oscar Campbell, male    DOB: 03/11/1956  Age: 68 y.o. MRN: [id number]  CC:   Chief Complaint  Patient presents with   Hypertension    6 month f/u hypertension    Fatigue    Fatigue, recent cough. Feels better right now. Treating with otc medications.     HPI:  68 year old male with the below mentioned medical problems presents for follow-up.  Patient has been working on weight loss and has lost a significant amount of weight.  Weight in May 2023 was 251.  He is 216 today.  He is pleased with his progress and is hopeful that this is positively impacting his blood sugars.  Needs labs today.  Last A1c was 5.9.  He is compliant with metformin.  Needs lipid panel today to reassess lipids.  Last LDL was 100.  He is on Crestor.  He is also hopeful that weight loss will help him potentially get off of statin therapy.  Pretension stable on losartan and HCTZ.  Patient Active Problem List   Diagnosis Date Noted   Type 2 diabetes mellitus without complications (HCC) 12/16/2020   Mixed hyperlipidemia 12/16/2020   Kidney stones 05/15/2015   Erectile dysfunction 01/20/2013   Essential hypertension 01/20/2011    Social Hx   Social History   Socioeconomic History   Marital status: Divorced    Spouse name: Not on file   Number of children: Not on file   Years of education: Not on file   Highest education level: Some college, no degree  Occupational History   Not on file  Tobacco Use   Smoking status: Never   Smokeless tobacco: Never  Substance and Sexual Activity   Alcohol use: No    Alcohol/week: 0.0 standard drinks of alcohol   Drug use: No   Sexual activity: Yes  Other Topics Concern   Not on file  Social History Narrative   Not on file   Social Drivers of Health   Financial Resource Strain: Medium Risk (07/31/2023)   Overall Financial Resource Strain (CARDIA)    Difficulty of Paying Living Expenses: Somewhat hard  Food Insecurity: Food Insecurity  Present (07/31/2023)   Hunger Vital Sign    Worried About Running Out of Food in the Last Year: Sometimes true    Ran Out of Food in the Last Year: Sometimes true  Transportation Needs: No Transportation Needs (07/31/2023)   PRAPARE - Administrator, Civil Service (Medical): No    Lack of Transportation (Non-Medical): No  Physical Activity: Unknown (07/31/2023)   Exercise Vital Sign    Days of Exercise per Week: 0 days    Minutes of Exercise per Session: Not on file  Stress: No Stress Concern Present (07/31/2023)   [employer] of Occupational Health - Occupational Stress Questionnaire    Feeling of Stress : Only a little  Social Connections: Moderately Integrated (07/31/2023)   Social Connection and Isolation Panel [NHANES]    Frequency of Communication with Friends and Family: More than three times a week    Frequency of Social Gatherings with Friends and Family: More than three times a week    Attends Religious Services: More than 4 times per year    Active Member of [employer] or Organizations: Yes    Attends Engineer, structural: More than 4 times per year    Marital Status: Divorced    Review of Systems  Respiratory: Negative.    Cardiovascular: Negative.  Objective:  BP 118/68   Pulse 77   Temp 98.1 F (36.7 C)   Ht 5\' 11"  (1.803 m)   Wt 216 lb (98 kg)   SpO2 99%   BMI 30.13 kg/m      08/04/2023    9:22 AM 02/04/2023   10:34 AM 02/04/2023   10:05 AM  BP/Weight  Systolic BP 118 144 148  Diastolic BP 68 69 87  Wt. (Lbs) 216  224.8  BMI 30.13 kg/m2  31.35 kg/m2    Physical Exam Vitals and nursing note reviewed.  Constitutional:      General: He is not in acute distress.    Appearance: Normal appearance.  HENT:     Head: Normocephalic and atraumatic.  Eyes:     General:        Right eye: No discharge.        Left eye: No discharge.     Conjunctiva/sclera: Conjunctivae normal.  Cardiovascular:     Rate and Rhythm: Normal rate and regular  rhythm.  Pulmonary:     Effort: Pulmonary effort is normal.     Breath sounds: Normal breath sounds. No wheezing, rhonchi or rales.  Neurological:     Mental Status: He is alert.  Psychiatric:        Mood and Affect: Mood normal.        Behavior: Behavior normal.     Lab Results  Component Value Date   WBC 8.1 02/04/2023   HGB 15.8 02/04/2023   HCT 47.7 02/04/2023   PLT 214 02/04/2023   GLUCOSE 132 (H) 02/04/2023   CHOL 170 02/04/2023   TRIG 185 (H) 02/04/2023   HDL 38 (L) 02/04/2023   LDLCALC 100 (H) 02/04/2023   ALT 28 02/04/2023   AST 27 02/04/2023   NA 140 02/04/2023   K 3.9 02/04/2023   CL 101 02/04/2023   CREATININE 0.92 02/04/2023   BUN 12 02/04/2023   CO2 24 02/04/2023   PSA 1.03 12/15/2013   HGBA1C 5.9 (H) 02/04/2023     Assessment & Plan:  Type 2 diabetes mellitus without complication, without long-term current use of insulin (HCC) Assessment & Plan: Stable.  Labs today.  Given weight loss, patient may be able to come off metformin.  Orders: -     CMP14+EGFR -     Hemoglobin A1c -     Microalbumin / creatinine urine ratio  Mixed hyperlipidemia Assessment & Plan: Reassessing today.  Continue statin.  Orders: -     Lipid panel  Screening PSA (prostate specific antigen) -     PSA  Screening for deficiency anemia -     CBC  Essential hypertension Assessment & Plan: Stable.  Continue current medications.  Medications refilled.  Orders: -     Losartan Potassium; Take 1 tablet (100 mg total) by mouth daily.  Dispense: 90 tablet; Refill: 3 -     hydroCHLOROthiazide; Take 1/2 (one-half) tablet by mouth once daily  Dispense: 45 tablet; Refill: 3    Follow-up:  6 months  Etha Stambaugh Adriana Simas DO [facility]

## 2023-08-04 NOTE — Patient Instructions (Signed)
Labs today.  Continue your medications.  Follow up in 6 months.

## 2023-08-04 NOTE — Assessment & Plan Note (Signed)
Reassessing today.  Continue statin.

## 2023-08-04 NOTE — Assessment & Plan Note (Signed)
 Stable.  Labs today.  Given weight loss, patient may be able to come off metformin.

## 2023-08-06 ENCOUNTER — Encounter: Payer: Self-pay | Admitting: Family Medicine

## 2023-08-06 LAB — CBC
Hematocrit: 48.1 % (ref 37.5–51.0)
Hemoglobin: 16.1 g/dL (ref 13.0–17.7)
MCH: 31 pg (ref 26.6–33.0)
MCHC: 33.5 g/dL (ref 31.5–35.7)
MCV: 93 fL (ref 79–97)
Platelets: 206 10*3/uL (ref 150–450)
RBC: 5.19 x10E6/uL (ref 4.14–5.80)
RDW: 13.9 % (ref 11.6–15.4)
WBC: 9.7 10*3/uL (ref 3.4–10.8)

## 2023-08-06 LAB — CMP14+EGFR
ALT: 25 IU/L (ref 0–44)
AST: 23 IU/L (ref 0–40)
Albumin: 4.7 g/dL (ref 3.9–4.9)
Alkaline Phosphatase: 89 IU/L (ref 44–121)
BUN/Creatinine Ratio: 19 (ref 10–24)
BUN: 17 mg/dL (ref 8–27)
Bilirubin Total: 0.9 mg/dL (ref 0.0–1.2)
CO2: 23 mmol/L (ref 20–29)
Calcium: 9.8 mg/dL (ref 8.6–10.2)
Chloride: 100 mmol/L (ref 96–106)
Creatinine, Ser: 0.91 mg/dL (ref 0.76–1.27)
Globulin, Total: 3.2 g/dL (ref 1.5–4.5)
Glucose: 122 mg/dL — ABNORMAL HIGH (ref 70–99)
Potassium: 3.8 mmol/L (ref 3.5–5.2)
Sodium: 141 mmol/L (ref 134–144)
Total Protein: 7.9 g/dL (ref 6.0–8.5)
eGFR: 93 mL/min/{1.73_m2} (ref >59)

## 2023-08-06 LAB — MICROALBUMIN / CREATININE URINE RATIO
Creatinine, Urine: 193.1 mg/dL
Microalb/Creat Ratio: 12 mg/g{creat} (ref 0–29)
Microalbumin, Urine: 23.2 ug/mL

## 2023-08-06 LAB — LIPID PANEL
Chol/HDL Ratio: 2.8 ratio (ref 0.0–5.0)
Cholesterol, Total: 115 mg/dL (ref 100–199)
HDL: 41 mg/dL (ref >39)
LDL Chol Calc (NIH): 52 mg/dL (ref 0–99)
Triglycerides: 121 mg/dL (ref 0–149)
VLDL Cholesterol Cal: 22 mg/dL (ref 5–40)

## 2023-08-06 LAB — HEMOGLOBIN A1C
Est. average glucose Bld gHb Est-mCnc: 111 mg/dL
Hgb A1c MFr Bld: 5.5 % (ref 4.8–5.6)

## 2023-08-06 LAB — PSA: Prostate Specific Ag, Serum: 1.9 ng/mL (ref 0.0–4.0)

## 2023-08-11 ENCOUNTER — Other Ambulatory Visit: Payer: Self-pay | Admitting: Family Medicine

## 2023-08-11 ENCOUNTER — Other Ambulatory Visit: Payer: Self-pay

## 2023-08-11 DIAGNOSIS — E119 Type 2 diabetes mellitus without complications: Secondary | ICD-10-CM

## 2023-08-11 MED ORDER — METFORMIN HCL ER 500 MG PO TB24: 500.0000 mg | ORAL_TABLET | Freq: Every day | ORAL | 1 refills | Status: DC

## 2023-10-12 ENCOUNTER — Ambulatory Visit: Admitting: Nurse Practitioner

## 2023-10-12 ENCOUNTER — Encounter: Payer: Self-pay | Admitting: Nurse Practitioner

## 2023-10-12 ENCOUNTER — Ambulatory Visit: Payer: Self-pay

## 2023-10-12 VITALS — BP 118/70 | HR 76 | Temp 97.9°F | Ht 71.0 in | Wt 221.0 lb

## 2023-10-12 DIAGNOSIS — B9689 Other specified bacterial agents as the cause of diseases classified elsewhere: Secondary | ICD-10-CM

## 2023-10-12 DIAGNOSIS — J069 Acute upper respiratory infection, unspecified: Secondary | ICD-10-CM

## 2023-10-12 MED ORDER — AZITHROMYCIN 250 MG PO TABS: ORAL_TABLET | ORAL | 0 refills | Status: DC

## 2023-10-12 MED ORDER — ALBUTEROL SULFATE HFA 108 (90 BASE) MCG/ACT IN AERS: 2.0000 | INHALATION_SPRAY | Freq: Four times a day (QID) | RESPIRATORY_TRACT | 0 refills | Status: AC | PRN

## 2023-10-12 NOTE — Telephone Encounter (Signed)
 Copied from CRM [id number]. Topic: Clinical - Red Word Triage >> Oct 12, 2023  8:44 AM Oddis Bench wrote: Red Word that prompted transfer to Nurse Triage: Reason for Triage: patient is calling about a cough that he has had for 3 weeks, he is stating that he is wheezing when he breaths   Chief Complaint: Cough  Symptoms: Wheezing  Frequency: 2 Weeks  Pertinent Negatives: Patient denies chest pain, dyspnea Disposition: [] ED /[] Urgent Care (no appt availability in office) / [] Appointment(In office/virtual)/ []  [facility]/ [] Home Care/ [] Refused Recommended Disposition /[] [facility]/ []  Follow-up with PCP Additional Notes: PC is being triaged for a cough that has lingered for 2 weeks. The cough is productive and there is occasional wheezing. In office appointment made for this morning.   Reason for Disposition  Wheezing is present  Answer Assessment - Initial Assessment Questions 1. ONSET: "When did the cough begin?"      2 weeks  2. SEVERITY: "How bad is the cough today?"      Intermittent  3. SPUTUM: "Describe the color of your sputum" (none, dry cough; clear, white, yellow, green)     Yellowish to clear  4. HEMOPTYSIS: "Are you coughing up any blood?" If so ask: "How much?" (flecks, streaks, tablespoons, etc.)     No  5. DIFFICULTY BREATHING: "Are you having difficulty breathing?" If Yes, ask: "How bad is it?" (e.g., mild, moderate, severe)    - MILD: No SOB at rest, mild SOB with walking, speaks normally in sentences, can lie down, no retractions, pulse < 100.    - MODERATE: SOB at rest, SOB with minimal exertion and prefers to sit, cannot lie down flat, speaks in phrases, mild retractions, audible wheezing, pulse 100-120.    - SEVERE: Very SOB at rest, speaks in single words, struggling to breathe, sitting hunched forward, retractions, pulse > 120      No  6. FEVER: "Do you have a fever?" If Yes, ask: "What is your temperature, how was it measured, and when  did it start?"     No, not currently. Wednesday 99.6  7. CARDIAC HISTORY: "Do you have any history of heart disease?" (e.g., heart attack, congestive heart failure)      None  8. LUNG HISTORY: "Do you have any history of lung disease?"  (e.g., pulmonary embolus, asthma, emphysema)     None  9. PE RISK FACTORS: "Do you have a history of blood clots?" (or: recent major surgery, recent prolonged travel, bedridden)     No  10. OTHER SYMPTOMS: "Do you have any other symptoms?" (e.g., runny nose, wheezing, chest pain)       Wheezing, Chest Soreness   12. TRAVEL: "Have you traveled out of the country in the last month?" (e.g., travel history, exposures)       No  Protocols used: Cough - Acute Productive-A-AH

## 2023-10-12 NOTE — Progress Notes (Signed)
   Subjective:    Patient ID: Oscar Campbell, male    DOB: [birth year], 68 y.o.   MRN: [id number]  HPI  Cough and congestion over the past 2 weeks Nasal congestion and wheezing in the mornings Presents for complaints of cough and congestion over the past 2 weeks.  Yellow phlegm at first, mostly clear now.  Low-grade fever.  Increased cough at night and when he gets hot/active.  Slight chest tightness, wheezing mainly in the mornings.  No shortness of breath.  Mild head congestion.  Slight sinus pressure, no sinus headache..  No ear pain.  Had a sore throat but this has resolved.  Non-smoker.  Has tried TheraFlu and NyQuil for his symptoms.  Zyrtec seems to help the most.  Social History   Tobacco Use   Smoking status: Never   Smokeless tobacco: Never  Substance Use Topics   Alcohol use: No    Alcohol/week: 0.0 standard drinks of alcohol   Drug use: No        Objective:   Physical Exam NAD.  Alert, oriented.  TMs clear effusion, no erythema.  Pharynx clear and moist.  Neck supple with moderate soft nontender anterior cervical adenopathy.  Lungs clear.  No tachypnea.  Heart regular rate rhythm. Today's Vitals   10/12/23 1127  BP: 118/70  Pulse: 76  Temp: 97.9 F (36.6 C)  SpO2: 96%  Weight: 221 lb (100.2 kg)  Height: 5\' 11"  (1.803 m)   Body mass index is 30.82 kg/m.        Assessment & Plan:  Bacterial URI Meds ordered this encounter  Medications   azithromycin  (ZITHROMAX  Z-PAK) 250 MG tablet    Sig: Take 2 tablets (500 mg) on  Day 1,  followed by 1 tablet (250 mg) once daily on Days 2 through 5.    Dispense:  6 each    Refill:  0    Supervising Provider:   Charlotta Cook A [[id number]]   albuterol  (VENTOLIN  HFA) 108 (90 Base) MCG/ACT inhaler    Sig: Inhale 2 puffs into the lungs every 6 (six) hours as needed for wheezing or shortness of breath.    Dispense:  8 g    Refill:  0    Supervising Provider:   Charlotta Cook A [[id number]]   Start Z-Pak as directed.  Continue OTC meds  as directed for symptomatic care. Start albuterol  inhaler as directed. Call back by the end of the week if no improvement in wheezing, consider short course of steroids at that time.  Warning signs reviewed.  Recheck if worsens or persists.

## 2023-10-21 ENCOUNTER — Other Ambulatory Visit: Payer: Self-pay | Admitting: Family Medicine

## 2023-11-03 ENCOUNTER — Other Ambulatory Visit

## 2023-11-03 ENCOUNTER — Other Ambulatory Visit: Payer: Self-pay

## 2023-11-03 DIAGNOSIS — N200 Calculus of kidney: Secondary | ICD-10-CM

## 2023-11-03 DIAGNOSIS — R972 Elevated prostate specific antigen [PSA]: Secondary | ICD-10-CM

## 2023-11-04 LAB — PSA: Prostate Specific Ag, Serum: 1.5 ng/mL (ref 0.0–4.0)

## 2023-11-10 ENCOUNTER — Ambulatory Visit (HOSPITAL_COMMUNITY)
Admission: RE | Admit: 2023-11-10 | Discharge: 2023-11-10 | Disposition: A | Discharge status: Home / Self Care | Source: Ambulatory Visit | Attending: Urology | Admitting: Urology

## 2023-11-10 DIAGNOSIS — N200 Calculus of kidney: Secondary | ICD-10-CM | POA: Diagnosis present

## 2023-11-11 ENCOUNTER — Ambulatory Visit: Payer: Self-pay

## 2023-11-13 ENCOUNTER — Ambulatory Visit: Admitting: Urology

## 2023-11-13 ENCOUNTER — Encounter: Payer: Self-pay | Admitting: Urology

## 2023-11-13 VITALS — BP 132/72 | HR 68

## 2023-11-13 DIAGNOSIS — R972 Elevated prostate specific antigen [PSA]: Secondary | ICD-10-CM | POA: Diagnosis not present

## 2023-11-13 DIAGNOSIS — N200 Calculus of kidney: Secondary | ICD-10-CM

## 2023-11-13 DIAGNOSIS — N3000 Acute cystitis without hematuria: Secondary | ICD-10-CM

## 2023-11-13 DIAGNOSIS — Z87442 Personal history of urinary calculi: Secondary | ICD-10-CM

## 2023-11-13 LAB — URINALYSIS, ROUTINE W REFLEX MICROSCOPIC
Bilirubin, UA: NEGATIVE
Glucose, UA: NEGATIVE
Ketones, UA: NEGATIVE
Nitrite, UA: POSITIVE — AB
Specific Gravity, UA: 1.025 (ref 1.005–1.030)
Urobilinogen, Ur: 0.2 mg/dL (ref 0.2–1.0)
pH, UA: 6 (ref 5.0–7.5)

## 2023-11-13 LAB — MICROSCOPIC EXAMINATION: WBC, UA: >30 /HPF — AB (ref 0–5)

## 2023-11-13 NOTE — Patient Instructions (Signed)

## 2023-11-13 NOTE — Progress Notes (Signed)
 11/13/2023 10:47 AM   Art Largo [birth year] [id number]  Referring provider: Myrna Ast, DO [address] [place],  [place] [address]  Followup elevated PSA and nephrolithiasis   HPI: Mr Oscar Campbell is a 68yo here for followup for elevated PSA and nephrolithiasis. PSA 1.5. KUB from today shows no definitive calculi. No stone events since last visit. No flank pain. IPSS 0 QOL 0 on no BPH therapy. Urine stream strong. No straining to urinate. UA shows RBCs and WBCs   PMH: Past Medical History:  Diagnosis Date   Complication of anesthesia    woke up during 1st colonscopy   Elevated PSA    Erectile dysfunction    Hypertension    Impaired fasting glucose    Kidney stones    Obesity    Seasonal rhinitis     Surgical History: Past Surgical History:  Procedure Laterality Date   CARDIOVASCULAR STRESS TEST     COLONOSCOPY  2009   Dr. Riley Cheadle: tubular adenoma   COLONOSCOPY N/A 03/02/2015   Procedure: COLONOSCOPY;  Surgeon: Suzette Espy, MD;  Location: [facility];  Service: Endoscopy;  Laterality: N/A;  0830   CYSTOSCOPY WITH STENT PLACEMENT Right 05/15/2015   Procedure: CYSTOSCOPY WITH EXTERNALIZED STENT PLACEMENT;  Surgeon: Marco Severs, MD;  Location: [facility];  Service: Urology;  Laterality: Right;   HOLMIUM LASER APPLICATION Right 05/15/2015   Procedure: HOLMIUM LASER APPLICATION;  Surgeon: Marco Severs, MD;  Location: [facility];  Service: Urology;  Laterality: Right;   NEPHROLITHOTOMY Right 05/15/2015   Procedure: NEPHROLITHOTOMY PERCUTANEOUS WITH RIGHT NEPHROSTOMY TUBE PLACEMENT, SURGEON TO OBTAIN ACCESS;  Surgeon: Marco Severs, MD;  Location: [facility];  Service: Urology;  Laterality: Right;    Home Medications:  Allergies as of 11/13/2023       Reactions   Penicillins Rash   Has patient had a PCN reaction causing immediate rash, facial/tongue/throat swelling, SOB or lightheadedness with hypotension: unkown Has patient had a PCN reaction causing severe  rash involving mucus membranes or skin necrosis: no Has patient had a PCN reaction that required hospitalization no Has patient had a PCN reaction occurring within the last 10 years: no If all of the above answers are NO, then may proceed with Cephalosporin use.        Medication List        Accurate as of November 13, 2023 10:47 AM. If you have any questions, ask your nurse or doctor.          albuterol  108 (90 Base) MCG/ACT inhaler Commonly known as: VENTOLIN  HFA Inhale 2 puffs into the lungs every 6 (six) hours as needed for wheezing or shortness of breath.   aspirin  EC 81 MG tablet Take 81 mg by mouth daily.   azithromycin  250 MG tablet Commonly known as: Zithromax  Z-Pak Take 2 tablets (500 mg) on  Day 1,  followed by 1 tablet (250 mg) once daily on Days 2 through 5.   fluticasone  50 MCG/ACT nasal spray Commonly known as: FLONASE  Place 2 sprays into both nostrils daily.   hydrochlorothiazide  25 MG tablet Commonly known as: HYDRODIURIL  Take 1/2 (one-half) tablet by mouth once daily   levocetirizine 5 MG tablet Commonly known as: Xyzal  Allergy 24HR Take 1 tablet (5 mg total) by mouth every evening for 5 days.   losartan  100 MG tablet Commonly known as: COZAAR  Take 1 tablet (100 mg total) by mouth daily.   metFORMIN  500 MG 24 hr tablet Commonly known  as: GLUCOPHAGE -XR Take 1 tablet (500 mg total) by mouth daily with breakfast.   rosuvastatin  10 MG tablet Commonly known as: CRESTOR  Take 1 tablet by mouth once daily        Allergies:  Allergies  Allergen Reactions   Penicillins Rash    Has patient had a PCN reaction causing immediate rash, facial/tongue/throat swelling, SOB or lightheadedness with hypotension: unkown Has patient had a PCN reaction causing severe rash involving mucus membranes or skin necrosis: no Has patient had a PCN reaction that required hospitalization no Has patient had a PCN reaction occurring within the last 10 years: no If all of  the above answers are NO, then may proceed with Cephalosporin use.    Family History: Family History  Problem Relation Age of Onset   Heart disease Father    Colon cancer Neg Hx     Social History:  reports that he has never smoked. He has never used smokeless tobacco. He reports that he does not drink alcohol and does not use drugs.  ROS: All other review of systems were reviewed and are negative except what is noted above in HPI  Physical Exam: BP 132/72   Pulse 68   Constitutional:  Alert and oriented, No acute distress. HEENT: [place] AT, moist mucus membranes.  Trachea midline, no masses. Cardiovascular: No clubbing, cyanosis, or edema. Respiratory: Normal respiratory effort, no increased work of breathing. GI: Abdomen is soft, nontender, nondistended, no abdominal masses GU: No CVA tenderness.  Lymph: No cervical or inguinal lymphadenopathy. Skin: No rashes, bruises or suspicious lesions. Neurologic: Grossly intact, no focal deficits, moving all 4 extremities. Psychiatric: Normal mood and affect.  Laboratory Data: Lab Results  Component Value Date   WBC 9.7 08/04/2023   HGB 16.1 08/04/2023   HCT 48.1 08/04/2023   MCV 93 08/04/2023   PLT 206 08/04/2023    Lab Results  Component Value Date   CREATININE 0.91 08/04/2023    Lab Results  Component Value Date   PSA 1.03 12/15/2013   PSA 0.91 01/22/2013    No results found for: TESTOSTERONE  Lab Results  Component Value Date   HGBA1C 5.5 08/04/2023    Urinalysis    Component Value Date/Time   COLORURINE YELLOW 04/23/2015 0520   APPEARANCEUR Cloudy (A) 08/20/2022 1033   LABSPEC >1.030 (H) 04/23/2015 0520   PHURINE 6.0 04/23/2015 0520   GLUCOSEU Negative 08/20/2022 1033   HGBUR MODERATE (A) 04/23/2015 0520   BILIRUBINUR Negative 08/20/2022 1033   KETONESUR NEGATIVE 04/23/2015 0520   PROTEINUR 1+ (A) 08/20/2022 1033   PROTEINUR 100 (A) 04/23/2015 0520   NITRITE Positive (A) 08/20/2022 1033   NITRITE  NEGATIVE 04/23/2015 0520   LEUKOCYTESUR 3+ (A) 08/20/2022 1033    Lab Results  Component Value Date   LABMICR 23.2 08/04/2023   WBCUA >30 (A) 08/20/2022   LABEPIT 0-10 08/20/2022   BACTERIA Many (A) 08/20/2022    Pertinent Imaging: KUb today: Images reviewed and discussed with the patient  Results for orders placed in visit on 08/13/22  DG Abd 1 View  Narrative CLINICAL DATA:  Kidney stone  EXAM: ABDOMEN - 1 VIEW  COMPARISON:  08/21/2021  FINDINGS: Unremarkable bowel gas pattern. Bilateral 3-5 mm pelvic calcifications consistent with stones or phleboliths.  IMPRESSION: Pelvic calcifications consistent with stones or phleboliths. This distinction could be made with a CT if indicated.   Electronically Signed By: Sydell Eva M.D. On: 08/19/2022 13:46  No results found for this or any previous visit.  No results found for this or any previous visit.  No results found for this or any previous visit.  Results for orders placed during the hospital encounter of 03/27/17  US  RENAL  Narrative CLINICAL DATA:  Follow-up renal calculus and left-sided cyst.  EXAM: RENAL / URINARY TRACT ULTRASOUND COMPLETE  COMPARISON:  March 17, 2016  FINDINGS: Right Kidney:  Length: 12.7 cm. There is a 9 mm shadowing calculus in the lower pole of the right kidney, unchanged in the interval with no evidence of hydronephrosis/obstruction.  Left Kidney:  Length: 12.7 cm. There is a 2 x 1.4 x 1.2 cm cyst in the upper pole of the left kidney versus 1.4 x 1.2 x 1.1 cm on the previous study.  Bladder:  Appears normal for degree of bladder distention.  IMPRESSION: 1. Nonshadowing stone in the lower pole of the right kidney, unchanged. 2. The cyst in the upper pole of the left kidney is a little larger in the interval now measuring up to 2 cm.   Electronically Signed By: Lorrene Rosser III M.D On: 03/28/2017 07:15  No results found for this or any previous  visit.  No results found for this or any previous visit.  Results for orders placed in visit on 02/06/20  CT RENAL STONE STUDY  Narrative CLINICAL DATA:  Bilateral flank pain for 1 week.  EXAM: CT ABDOMEN AND PELVIS WITHOUT CONTRAST  TECHNIQUE: Multidetector CT imaging of the abdomen and pelvis was performed following the standard protocol without IV contrast.  COMPARISON:  04/23/2015  FINDINGS: Lower chest: No acute pulmonary findings. No pleural or pericardial effusion. 3.5 mm nodule in the right middle lobe is unchanged since 2016 and considered benign.  Hepatobiliary: No hepatic lesions are identified without contrast. No intrahepatic biliary dilatation. The gallbladder appears normal. No common bile duct dilatation.  Pancreas: No mass, inflammation or ductal dilatation.  Spleen: The spleen is upper limits of normal in size. No splenic lesions.  Adrenals/Urinary Tract: Small left adrenal gland nodule is stable and consistent with a benign adenoma. The right adrenal gland is unremarkable.  Small lower pole right renal calculi are noted. No obstructing ureteral calculi or hydroureteronephrosis.  No left-sided renal or ureteral calculi. There is a small left renal cyst noted.  The bladder is unremarkable. No bladder calculi or obvious mass without contrast.  Stomach/Bowel: Stomach, duodenum, small bowel and colon are unremarkable. No acute inflammatory changes, mass lesions or obstructive findings. The terminal ileum is normal. The appendix is normal.  Vascular/Lymphatic: The aorta is normal in caliber. No atheroscerlotic calcifications. No mesenteric of retroperitoneal mass or adenopathy. Small scattered lymph nodes are noted.  Reproductive: The prostate gland and seminal vesicles are unremarkable.  Other: No pelvic mass or adenopathy. No free pelvic fluid collections. No inguinal mass or adenopathy. No abdominal wall hernia or subcutaneous  lesions.  Musculoskeletal: No significant bony findings.  IMPRESSION: 1. Small lower pole right renal calculi but no obstructing ureteral calculi or hydroureteronephrosis. 2. No acute abdominal/pelvic findings, mass lesions or adenopathy. 3. Stable small left adrenal gland adenoma.   Electronically Signed By: Marrian Siva M.D. On: 02/07/2020 10:43   Assessment & Plan:    1. Kidney stones (Primary) -Followup 1 year with KUB - Urinalysis, Routine w reflex microscopic  2. Elevated PSA -Followup 1 year with PSA   No follow-ups on file.  Johnie Nailer, MD  [facility]

## 2023-11-16 ENCOUNTER — Telehealth: Payer: Self-pay | Admitting: Urology

## 2023-11-16 NOTE — Telephone Encounter (Signed)
 Patient returned call requesting urine results results.   Test was completed on 11/13/23  Best number to contact patient [phone]  Call transferred to clinical basket. Notified patient someone would return their call in 24 hours.   Patient called said that he seen his results on Mychart and it says he has bacteria in his urine , what are the next steps.

## 2023-11-16 NOTE — Telephone Encounter (Signed)
 Pt returned call and was notified that urine culture was sent off and we have not received the results yet pt stated when we do if he does not answer the phone it is okay to lvm on home phone number and send a Rx if needed to [facility] pt also wanted to know was it anything to be concerned about pt advised a message would be sent to his provider pt voiced his understanding

## 2023-11-16 NOTE — Telephone Encounter (Signed)
 Called pt to notify him his urine culture results are not back lvm to c/b no DPR

## 2023-11-18 LAB — URINE CULTURE

## 2023-11-19 MED ORDER — NITROFURANTOIN MONOHYD MACRO 100 MG PO CAPS: 100.0000 mg | ORAL_CAPSULE | Freq: Two times a day (BID) | ORAL | 0 refills | Status: DC

## 2023-11-19 NOTE — Telephone Encounter (Signed)
Patient called and made aware of positive urine culture and Macrobid sent to pharmacy per MD.

## 2023-11-19 NOTE — Addendum Note (Signed)
 Addended by: MALACHY SLICE on: 11/19/2023 04:43 PM   Modules accepted: Orders

## 2023-12-16 ENCOUNTER — Telehealth: Payer: Self-pay

## 2023-12-16 NOTE — Telephone Encounter (Signed)
 Communication  Reason for CRM: Pt wants to have lab work a week prior to his appt in September. Please advise once labs are ready.            Best contact: [phone]

## 2023-12-20 ENCOUNTER — Other Ambulatory Visit: Payer: Self-pay | Admitting: Family Medicine

## 2023-12-20 DIAGNOSIS — E782 Mixed hyperlipidemia: Secondary | ICD-10-CM

## 2023-12-20 DIAGNOSIS — E119 Type 2 diabetes mellitus without complications: Secondary | ICD-10-CM

## 2024-02-01 DIAGNOSIS — E119 Type 2 diabetes mellitus without complications: Secondary | ICD-10-CM | POA: Diagnosis not present

## 2024-02-01 DIAGNOSIS — E782 Mixed hyperlipidemia: Secondary | ICD-10-CM | POA: Diagnosis not present

## 2024-02-02 ENCOUNTER — Ambulatory Visit: Payer: Self-pay | Admitting: Family Medicine

## 2024-02-02 LAB — CMP14+EGFR
ALT: 26 IU/L (ref 0–44)
AST: 25 IU/L (ref 0–40)
Albumin: 4.3 g/dL (ref 3.9–4.9)
Alkaline Phosphatase: 87 IU/L (ref 44–121)
BUN/Creatinine Ratio: 11 (ref 10–24)
BUN: 10 mg/dL (ref 8–27)
Bilirubin Total: 0.6 mg/dL (ref 0.0–1.2)
CO2: 20 mmol/L (ref 20–29)
Calcium: 9.2 mg/dL (ref 8.6–10.2)
Chloride: 102 mmol/L (ref 96–106)
Creatinine, Ser: 0.93 mg/dL (ref 0.76–1.27)
Globulin, Total: 2.7 g/dL (ref 1.5–4.5)
Glucose: 118 mg/dL — ABNORMAL HIGH (ref 70–99)
Potassium: 3.9 mmol/L (ref 3.5–5.2)
Sodium: 138 mmol/L (ref 134–144)
Total Protein: 7 g/dL (ref 6.0–8.5)
eGFR: 90 mL/min/1.73 (ref >59)

## 2024-02-02 LAB — LIPID PANEL
Chol/HDL Ratio: 2.6 ratio (ref 0.0–5.0)
Cholesterol, Total: 110 mg/dL (ref 100–199)
HDL: 43 mg/dL (ref >39)
LDL Chol Calc (NIH): 49 mg/dL (ref 0–99)
Triglycerides: 94 mg/dL (ref 0–149)
VLDL Cholesterol Cal: 18 mg/dL (ref 5–40)

## 2024-02-02 LAB — HEMOGLOBIN A1C
Est. average glucose Bld gHb Est-mCnc: 123 mg/dL
Hgb A1c MFr Bld: 5.9 % — ABNORMAL HIGH (ref 4.8–5.6)

## 2024-02-05 ENCOUNTER — Ambulatory Visit: Admitting: Family Medicine

## 2024-02-09 ENCOUNTER — Ambulatory Visit: Admitting: Family Medicine

## 2024-02-09 ENCOUNTER — Telehealth: Payer: Self-pay | Admitting: Pharmacy Technician

## 2024-02-09 ENCOUNTER — Other Ambulatory Visit (HOSPITAL_COMMUNITY): Payer: Self-pay

## 2024-02-09 VITALS — BP 132/80 | HR 71 | Ht 71.0 in | Wt 227.0 lb

## 2024-02-09 DIAGNOSIS — E119 Type 2 diabetes mellitus without complications: Secondary | ICD-10-CM

## 2024-02-09 DIAGNOSIS — E782 Mixed hyperlipidemia: Secondary | ICD-10-CM | POA: Diagnosis not present

## 2024-02-09 DIAGNOSIS — I10 Essential (primary) hypertension: Secondary | ICD-10-CM

## 2024-02-09 DIAGNOSIS — N529 Male erectile dysfunction, unspecified: Secondary | ICD-10-CM | POA: Diagnosis not present

## 2024-02-09 MED ORDER — SILDENAFIL CITRATE 20 MG PO TABS: 20.0000 mg | ORAL_TABLET | Freq: Every day | ORAL | 1 refills | Status: AC | PRN

## 2024-02-09 NOTE — Patient Instructions (Signed)
Follow up in 6 months.  Take care  Dr. Wren Gallaga  

## 2024-02-09 NOTE — Progress Notes (Signed)
 Subjective:  Patient ID: Oscar Campbell, male    DOB: [birth year]  Age: 68 y.o. MRN: [id number]  CC:   Chief Complaint  Patient presents with   Medical Management of Chronic Issues    HPI:  68 year old male presents for follow-up.  Hypertension stable.  A1c has been at goal.  He is now off metformin .  Lipids at goal as well.  He is tolerating Crestor  well.  Patient states that he is having some difficulty with erections.  He would like to discuss this today.  Needs foot exam today.  Declines influenza vaccine.  Declines pneumococcal vaccine.  Declines shingles vaccine.  Patient Active Problem List   Diagnosis Date Noted   Type 2 diabetes mellitus without complications (HCC) 12/16/2020   Mixed hyperlipidemia 12/16/2020   Kidney stones 05/15/2015   Erectile dysfunction 01/20/2013   Essential hypertension 01/20/2011    Social Hx   Social History   Socioeconomic History   Marital status: Divorced    Spouse name: Not on file   Number of children: Not on file   Years of education: Not on file   Highest education level: 12th grade  Occupational History   Not on file  Tobacco Use   Smoking status: Never   Smokeless tobacco: Never  Substance and Sexual Activity   Alcohol use: No    Alcohol/week: 0.0 standard drinks of alcohol   Drug use: No   Sexual activity: Yes  Other Topics Concern   Not on file  Social History Narrative   Not on file   Social Drivers of Health   Financial Resource Strain: Medium Risk (02/05/2024)   Overall Financial Resource Strain (CARDIA)    Difficulty of Paying Living Expenses: Somewhat hard  Food Insecurity: Food Insecurity Present (02/05/2024)   Hunger Vital Sign    Worried About Running Out of Food in the Last Year: Sometimes true    Ran Out of Food in the Last Year: Sometimes true  Transportation Needs: No Transportation Needs (02/05/2024)   PRAPARE - Administrator, Civil Service (Medical): No    Lack of Transportation  (Non-Medical): No  Physical Activity: Insufficiently Active (02/05/2024)   Exercise Vital Sign    Days of Exercise per Week: 3 days    Minutes of Exercise per Session: 30 min  Stress: Stress Concern Present (02/05/2024)   [employer] of Occupational Health - Occupational Stress Questionnaire    Feeling of Stress: To some extent  Social Connections: Moderately Integrated (02/05/2024)   Social Connection and Isolation Panel    Frequency of Communication with Friends and Family: More than three times a week    Frequency of Social Gatherings with Friends and Family: More than three times a week    Attends Religious Services: More than 4 times per year    Active Member of Clubs or Organizations: Yes    Attends Engineer, structural: More than 4 times per year    Marital Status: Divorced    Review of Systems Per HPI  Objective:  BP 132/80   Pulse 71   Ht 5' 11 (1.803 m)   Wt 227 lb (103 kg)   BMI 31.66 kg/m      02/09/2024    9:34 AM 11/13/2023   10:28 AM 10/12/2023   11:27 AM  BP/Weight  Systolic BP 132 132 118  Diastolic BP 80 72 70  Wt. (Lbs) 227  221  BMI 31.66 kg/m2  30.82 kg/m2    Physical  Exam Vitals and nursing note reviewed.  Constitutional:      General: He is not in acute distress.    Appearance: Normal appearance.  HENT:     Head: Normocephalic and atraumatic.  Eyes:     General:        Right eye: No discharge.        Left eye: No discharge.     Conjunctiva/sclera: Conjunctivae normal.  Cardiovascular:     Rate and Rhythm: Normal rate and regular rhythm.  Pulmonary:     Effort: Pulmonary effort is normal.     Breath sounds: Normal breath sounds. No wheezing, rhonchi or rales.  Feet:     Comments: Diabetic foot exam performed today.  See quality metrics section. Neurological:     Mental Status: He is alert.  Psychiatric:        Mood and Affect: Mood normal.        Behavior: Behavior normal.     Lab Results  Component Value Date   WBC  9.7 08/04/2023   HGB 16.1 08/04/2023   HCT 48.1 08/04/2023   PLT 206 08/04/2023   GLUCOSE 118 (H) 02/01/2024   CHOL 110 02/01/2024   TRIG 94 02/01/2024   HDL 43 02/01/2024   LDLCALC 49 02/01/2024   ALT 26 02/01/2024   AST 25 02/01/2024   NA 138 02/01/2024   K 3.9 02/01/2024   CL 102 02/01/2024   CREATININE 0.93 02/01/2024   BUN 10 02/01/2024   CO2 20 02/01/2024   PSA 1.03 12/15/2013   HGBA1C 5.9 (H) 02/01/2024     Assessment & Plan:  Essential hypertension Assessment & Plan: Stable.  Continue current medications.   Type 2 diabetes mellitus without complication, without long-term current use of insulin (HCC) Assessment & Plan: At goal.  Now off metformin .  Continue dietary/lifestyle changes.   Mixed hyperlipidemia Assessment & Plan: At goal.  Continue Crestor .   Erectile dysfunction, unspecified erectile dysfunction type Assessment & Plan: Sending in sildenafil .  Orders: -     Sildenafil  Citrate; Take 1-5 tablets (20-100 mg total) by mouth daily as needed.  Dispense: 30 tablet; Refill: 1    Follow-up: 6 Months  Severino Paolo Bluford DO [facility]

## 2024-02-09 NOTE — Assessment & Plan Note (Signed)
At goal.  Continue Crestor. 

## 2024-02-09 NOTE — Assessment & Plan Note (Signed)
 Stable.  Continue current medications.

## 2024-02-09 NOTE — Telephone Encounter (Signed)
 Pharmacy Patient Advocate Encounter   Received notification from CoverMyMeds that prior authorization for Sildenafil  Citrate (PAH) 20MG  tablets is required/requested.   Insurance verification completed.   The patient is insured through [facility] MEDICARE.   Per test claim: PA required; PA submitted to above mentioned insurance via Latent Key/confirmation #/EOC [id number] Status is pending

## 2024-02-09 NOTE — Assessment & Plan Note (Signed)
 At goal.  Now off metformin .  Continue dietary/lifestyle changes.

## 2024-02-09 NOTE — Assessment & Plan Note (Signed)
 Sending in sildenafil .

## 2024-02-09 NOTE — Telephone Encounter (Signed)
 Pharmacy Patient Advocate Encounter  Received notification from [facility] MEDICARE that Prior Authorization for Sildenafil  Citrate (PAH) 20MG  tablets has been DENIED.  Full denial letter will be uploaded to the media tab. See denial reason below.   PA #/Case ID/Reference #: [id number]  Not covered by Medicare Part D for indicated use.

## 2024-02-10 NOTE — Telephone Encounter (Signed)
 Cook, Jayce G, DO      02/09/24  4:51 PM Please inform patient.

## 2024-02-15 NOTE — Telephone Encounter (Unsigned)
 Copied from CRM #[id number]. Topic: Clinical - Prescription Issue >> Feb 15, 2024 10:01 AM Tinnie BROCKS wrote: Reason for CRM: Prior auth denied for Sildenafil  Citrate (PAH) 20MG  tablets. He is wondering if there's another way it can be requested to result in it being accepted. The insurance company told him it was denied because of how Dr. Bluford worded request. Ey#[id number]

## 2024-02-16 NOTE — Telephone Encounter (Signed)
 Pt called back and stated that [employer] stated that they would cover for ED but it needs to be worded differently. Pt states that it can be prescribed and covered for Ed and not just for plumonary. Please advise.

## 2024-03-28 DIAGNOSIS — E119 Type 2 diabetes mellitus without complications: Secondary | ICD-10-CM | POA: Diagnosis not present

## 2024-03-28 LAB — OPHTHALMOLOGY REPORT-SCANNED

## 2024-04-11 DIAGNOSIS — H01002 Unspecified blepharitis right lower eyelid: Secondary | ICD-10-CM | POA: Diagnosis not present

## 2024-04-11 DIAGNOSIS — H01004 Unspecified blepharitis left upper eyelid: Secondary | ICD-10-CM | POA: Diagnosis not present

## 2024-04-11 DIAGNOSIS — H01001 Unspecified blepharitis right upper eyelid: Secondary | ICD-10-CM | POA: Diagnosis not present

## 2024-04-11 DIAGNOSIS — H2513 Age-related nuclear cataract, bilateral: Secondary | ICD-10-CM | POA: Diagnosis not present

## 2024-05-09 DIAGNOSIS — H2511 Age-related nuclear cataract, right eye: Secondary | ICD-10-CM | POA: Diagnosis not present

## 2024-05-10 ENCOUNTER — Encounter (HOSPITAL_COMMUNITY): Payer: Self-pay

## 2024-05-10 ENCOUNTER — Other Ambulatory Visit: Payer: Self-pay

## 2024-05-10 ENCOUNTER — Encounter (HOSPITAL_COMMUNITY): Admission: RE | Admit: 2024-05-10 | Discharge: 2024-05-10 | Attending: Ophthalmology

## 2024-05-11 NOTE — H&P (Signed)
 Surgical History & Physical  Patient Name: Oscar Campbell  DOB: Oct 16, 1955  Surgery: Cataract extraction with intraocular lens implant phacoemulsification; Right Eye Surgeon: Lynwood Hermann MD Surgery Date: 05/13/2024 Pre-Op Date: 04/11/2024  HPI: A 89 Yr. old male patient 1. The patient is present for Cataract Evaluation. The patient complains of difficulty when reading fine print, books, newspaper, instructions etc., which began 6 months ago. Both eyes are affected. The episode is gradual. The patient describes foggy and hazy symptoms affecting their eyes/vision. This is negatively affecting the patient's quality of life and the patient is unable to function adequately in life with the current level of vision. HPI Completed by Dr. Lynwood Hermann  Medical History: Dry Eyes Cataracts  High Blood Pressure LDL  Review of Systems Cardiovascular High Blood Pressure Endocrine LDL All recorded systems are negative except as noted above.  Social Never smoked  Medication Prednisolone-moxiflox-bromfen,  Rosuvastatin , Hydrochlorothiazide , Losartan   Sx/Procedures None  Drug Allergies  Penicillin  History & Physical: Heent: cataracts NECK: supple without bruits LUNGS: lungs clear to auscultation CV: regular rate and rhythm Abdomen: soft and non-tender  Impression & Plan: Assessment: 1.  NUCLEAR SCLEROSIS AGE RELATED; Both Eyes (H25.13) 2.  BLEPHARITIS; Right Upper Lid, Right Lower Lid, Left Upper Lid, Left Lower Lid (H01.001, H01.002,H01.004,H01.005) 3.  DERMATOCHALASIS, no surgery; Right Upper Lid, Left Upper Lid (H02.831, H02.834) 4.  Pinguecula; Both Eyes (H11.153)  Plan: 1.  Cataract accounts for the patient's decreased vision. This visual impairment is not correctable with a tolerable change in glasses or contact lenses. Cataract surgery with an implantation of a new lens should significantly improve the visual and functional status of the patient. Recommend phacoemulsification  with intraocular lens. Discussed all risks, benefits, alternatives, and potential complications. Discussed the procedures and recovery. The patient desires to have surgery. A-scan/Biometry ordered and will be performed for intraocular lens calculations. The surgery will be performed in order to improve vision for driving, reading, and for eye examinations. Recommend Dextenza  for post-operative pain and inflammation. Educational materials provided: Cataract. History of corneal refractive Surgery: History of Previous Ocular Surgery (PPV, other): None History of ocular trauma: None Use of Eye Pressure Lowering Drops: None No current contact lens use. Pupil Status: Dilates poorly - shugarcaine or Lidocaine +Omidira by protocol, Malyugin Ring Right Eye first. OD worse, then OS.  2.  Blepharitis is present - recommend regular lid cleaning.  3.  Asymptomatic, recommend observation for now. Findings, prognosis and treatment options reviewed.  4.  Observe; Artificial tears as needed for irritation.

## 2024-05-13 ENCOUNTER — Ambulatory Visit (HOSPITAL_COMMUNITY): Admitting: Anesthesiology

## 2024-05-13 ENCOUNTER — Other Ambulatory Visit: Payer: Self-pay

## 2024-05-13 ENCOUNTER — Encounter (HOSPITAL_COMMUNITY): Admission: RE | Disposition: A | Payer: Self-pay | Source: Home / Self Care | Attending: Ophthalmology

## 2024-05-13 ENCOUNTER — Ambulatory Visit (HOSPITAL_COMMUNITY)
Admission: RE | Admit: 2024-05-13 | Discharge: 2024-05-13 | Disposition: A | Discharge status: Home / Self Care | Attending: Ophthalmology | Admitting: Ophthalmology

## 2024-05-13 ENCOUNTER — Encounter (HOSPITAL_COMMUNITY): Payer: Self-pay | Admitting: Ophthalmology

## 2024-05-13 DIAGNOSIS — H2511 Age-related nuclear cataract, right eye: Secondary | ICD-10-CM | POA: Insufficient documentation

## 2024-05-13 DIAGNOSIS — I10 Essential (primary) hypertension: Secondary | ICD-10-CM | POA: Insufficient documentation

## 2024-05-13 HISTORY — PX: CATARACT EXTRACTION W/PHACO: SHX586

## 2024-05-13 SURGERY — PHACOEMULSIFICATION, CATARACT, WITH IOL INSERTION
Anesthesia: Monitor Anesthesia Care | Site: Eye | Laterality: Right

## 2024-05-13 MED ORDER — SODIUM HYALURONATE 23MG/ML IO SOSY
PREFILLED_SYRINGE | INTRAOCULAR | Status: DC | PRN
  Administered 2024-05-13: .6 mL via INTRAOCULAR

## 2024-05-13 MED ORDER — LIDOCAINE HCL 3.5 % OP GEL
1.0000 | Freq: Once | OPHTHALMIC | Status: AC
  Administered 2024-05-13: 1 via OPHTHALMIC

## 2024-05-13 MED ORDER — PHENYLEPHRINE HCL 2.5 % OP SOLN
1.0000 [drp] | OPHTHALMIC | Status: AC | PRN
  Administered 2024-05-13 (×3): 1 [drp] via OPHTHALMIC

## 2024-05-13 MED ORDER — BSS IO SOLN
INTRAOCULAR | Status: DC | PRN
  Administered 2024-05-13: 15 mL via INTRAOCULAR

## 2024-05-13 MED ORDER — MIDAZOLAM HCL (PF) 2 MG/2ML IJ SOLN
INTRAMUSCULAR | Status: DC | PRN
  Administered 2024-05-13: 2 mg via INTRAVENOUS

## 2024-05-13 MED ORDER — MOXIFLOXACIN HCL 5 MG/ML IO SOLN
INTRAOCULAR | Status: DC | PRN
  Administered 2024-05-13: .2 mL via INTRACAMERAL

## 2024-05-13 MED ORDER — SODIUM HYALURONATE 10 MG/ML IO SOLUTION
PREFILLED_SYRINGE | INTRAOCULAR | Status: DC | PRN
  Administered 2024-05-13: .85 mL via INTRAOCULAR

## 2024-05-13 MED ORDER — TROPICAMIDE 1 % OP SOLN
1.0000 [drp] | OPHTHALMIC | Status: AC | PRN
  Administered 2024-05-13 (×3): 1 [drp] via OPHTHALMIC

## 2024-05-13 MED ORDER — MIDAZOLAM HCL 2 MG/2ML IJ SOLN
INTRAMUSCULAR | Status: AC
  Filled 2024-05-13: qty 2

## 2024-05-13 MED ORDER — LIDOCAINE HCL (PF) 1 % IJ SOLN
INTRAMUSCULAR | Status: DC | PRN
  Administered 2024-05-13: 1 mL

## 2024-05-13 MED ORDER — LACTATED RINGERS IV SOLN: INTRAVENOUS | Status: DC

## 2024-05-13 MED ORDER — POVIDONE-IODINE 5 % OP SOLN
OPHTHALMIC | Status: DC | PRN
  Administered 2024-05-13: 1 via OPHTHALMIC

## 2024-05-13 MED ORDER — SODIUM CHLORIDE 0.9% FLUSH
INTRAVENOUS | Status: DC | PRN
  Administered 2024-05-13: 10 mL via INTRAVENOUS

## 2024-05-13 MED ORDER — PHENYLEPHRINE-KETOROLAC 1-0.3 % IO SOLN
INTRAOCULAR | Status: DC | PRN
  Administered 2024-05-13: 500 mL via OPHTHALMIC

## 2024-05-13 MED ORDER — STERILE WATER FOR IRRIGATION IR SOLN
Status: DC | PRN
  Administered 2024-05-13: 1

## 2024-05-13 MED ORDER — TETRACAINE HCL 0.5 % OP SOLN
1.0000 [drp] | OPHTHALMIC | Status: AC | PRN
  Administered 2024-05-13 (×3): 1 [drp] via OPHTHALMIC

## 2024-05-13 SURGICAL SUPPLY — 11 items
CLOTH BEACON ORANGE TIMEOUT ST (SAFETY) ×1 IMPLANT
EYE SHIELD UNIVERSAL CLEAR (GAUZE/BANDAGES/DRESSINGS) IMPLANT
FEE CATARACT SUITE SIGHTPATH (MISCELLANEOUS) ×1 IMPLANT
GLOVE BIOGEL PI IND STRL 7.0 (GLOVE) ×2 IMPLANT
LENS IOL TECNIS EYHANCE 21.5 (Intraocular Lens) IMPLANT
NDL HYPO 18GX1.5 BLUNT FILL (NEEDLE) ×1 IMPLANT
NEEDLE HYPO 18GX1.5 BLUNT FILL (NEEDLE) ×1 IMPLANT
PAD ARMBOARD POSITIONER FOAM (MISCELLANEOUS) ×1 IMPLANT
SYR TB 1ML LL NO SAFETY (SYRINGE) ×1 IMPLANT
TAPE SURG TRANSPORE 1 IN (GAUZE/BANDAGES/DRESSINGS) IMPLANT
WATER STERILE IRR 250ML POUR (IV SOLUTION) ×1 IMPLANT

## 2024-05-13 NOTE — Transfer of Care (Addendum)
 Immediate Anesthesia Transfer of Care Note  Patient: Oscar Campbell  Procedure(s) Performed: PHACOEMULSIFICATION, CATARACT, WITH IOL INSERTION (Right: Eye)  Patient Location: Short Stay  Anesthesia Type:MAC  Level of Consciousness: awake and patient cooperative  Airway & Oxygen Therapy: Patient Spontanous Breathing  Post-op Assessment: Report given to RN and Post -op Vital signs reviewed and stable  Post vital signs: Reviewed and stable  Last Vitals:  Vitals Value Taken Time  BP 112/73 05/13/24   10:23  Temp 36.5 05/13/24   10:23  Pulse 72 05/13/24   10:23  Resp 12 05/13/24   10:23  SpO2 99% 05/13/24   10:23    Last Pain:  Vitals:   05/13/24 0917  TempSrc: Oral  PainSc: 0-No pain         Complications: No notable events documented.

## 2024-05-13 NOTE — Anesthesia Preprocedure Evaluation (Signed)
"                                    Anesthesia Evaluation  Patient identified by MRN, date of birth, ID band Patient awake    Reviewed: Allergy & Precautions, H&P , NPO status , Patient's Chart, lab work & pertinent test results, reviewed documented beta blocker date and time   Airway Mallampati: II  TM Distance: >3 FB Neck ROM: full    Dental no notable dental hx.    Pulmonary neg pulmonary ROS   Pulmonary exam normal breath sounds clear to auscultation       Cardiovascular Exercise Tolerance: Good hypertension, negative cardio ROS  Rhythm:regular Rate:Normal     Neuro/Psych negative neurological ROS  negative psych ROS   GI/Hepatic negative GI ROS, Neg liver ROS,,,  Endo/Other  negative endocrine ROS    Renal/GU negative Renal ROS  negative genitourinary   Musculoskeletal   Abdominal   Peds  Hematology negative hematology ROS (+)   Anesthesia Other Findings   Reproductive/Obstetrics negative OB ROS                              Anesthesia Physical Anesthesia Plan  ASA: 3  Anesthesia Plan: MAC   Post-op Pain Management:    Induction:   PONV Risk Score and Plan: Propofol  infusion  Airway Management Planned:   Additional Equipment:   Intra-op Plan:   Post-operative Plan:   Informed Consent: I have reviewed the patients History and Physical, chart, labs and discussed the procedure including the risks, benefits and alternatives for the proposed anesthesia with the patient or authorized representative who has indicated his/her understanding and acceptance.     Dental Advisory Given  Plan Discussed with: CRNA  Anesthesia Plan Comments:         Anesthesia Quick Evaluation  "

## 2024-05-13 NOTE — Op Note (Signed)
 Date of procedure: 05/13/2024  Pre-operative diagnosis: Visually significant age-related nuclear cataract, Right Eye (H25.11)  Post-operative diagnosis: Visually significant age-related nuclear cataract, Right Eye  Procedure: Removal of cataract via phacoemulsification and insertion of intra-ocular lens Vicci and Johnson DIB00 +21.5D into the capsular bag of the Right Eye  Attending surgeon: Lynwood LABOR. Donatello Kleve, MD, MA  Anesthesia: MAC, Topical Akten  Complications: None  Estimated Blood Loss: <67mL (minimal)  Specimens: None  Implants: As above  Indications:  Visually significant age-related cataract, Right Eye  Procedure:  The patient was seen and identified in the pre-operative area. The operative eye was identified and dilated.  The operative eye was marked.  Topical anesthesia was administered to the operative eye.     The patient was then to the operative suite and placed in the supine position.  A timeout was performed confirming the patient, procedure to be performed, and all other relevant information.   The patient's face was prepped and draped in the usual fashion for intra-ocular surgery.  A lid speculum was placed into the operative eye and the surgical microscope moved into place and focused.  A superotemporal paracentesis was created using a 20 gauge paracentesis blade. Omidria was injected into the anterior chamber. Shugarcaine was injected into the anterior chamber.  Viscoelastic was injected into the anterior chamber.  A temporal clear-corneal main wound incision was created using a 2.3mm microkeratome.  A continuous curvilinear capsulorrhexis was initiated using an irrigating cystitome and completed using capsulorrhexis forceps.  Hydrodissection and hydrodeliniation were performed.  Viscoelastic was injected into the anterior chamber.  A phacoemulsification handpiece and a chopper as a second instrument were used to remove the nucleus and epinucleus. The  irrigation/aspiration handpiece was used to remove any remaining cortical material.   The capsular bag was reinflated with viscoelastic, checked, and found to be intact.  The intraocular lens was inserted into the capsular bag.  The irrigation/aspiration handpiece was used to remove any remaining viscoelastic.  The clear corneal wound and paracentesis wounds were then hydrated and checked with Weck-Cels to be watertight. 0.1mL of moxifloxacin  was injected into the anterior chamber. The lid-speculum and drape was removed, and the patient's face was cleaned with a wet and dry 4x4.  A clear shield was taped over the eye. The patient was taken to the post-operative care unit in good condition, having tolerated the procedure well.  Post-Op Instructions: The patient will follow up at [facility] for a same day post-operative evaluation and will receive all other orders and instructions.

## 2024-05-13 NOTE — Discharge Instructions (Addendum)
 Please discharge patient when stable, will follow up today with Dr. June Leap at the [facility] office immediately following discharge.  Leave shield in place until visit.  All paperwork with discharge instructions will be given at the office.  [facility] Address:  [address]  [place], [place] [address]

## 2024-05-13 NOTE — Interval H&P Note (Signed)
 History and Physical Interval Note:  05/13/2024 9:54 AM  Oscar Campbell  has presented today for surgery, with the diagnosis of nuclear sclerotic cataract, right eye.  The various methods of treatment have been discussed with the patient and family. After consideration of risks, benefits and other options for treatment, the patient has consented to  Procedures: PHACOEMULSIFICATION, CATARACT, WITH IOL INSERTION (Right) as a surgical intervention.  The patient's history has been reviewed, patient examined, no change in status, stable for surgery.  I have reviewed the patient's chart and labs.  Questions were answered to the patient's satisfaction.     HARRIE AGENT

## 2024-05-13 NOTE — Anesthesia Procedure Notes (Signed)
 Date/Time: 05/13/2024 10:06 AM  Performed by: Para Jerelene CROME, CRNAOxygen Delivery Method: Nasal cannula

## 2024-05-15 NOTE — Anesthesia Postprocedure Evaluation (Signed)
"   Anesthesia Post Note  Patient: Oscar Campbell  Procedure(s) Performed: PHACOEMULSIFICATION, CATARACT, WITH IOL INSERTION (Right: Eye)  Patient location during evaluation: Phase II Anesthesia Type: MAC Level of consciousness: awake Pain management: pain level controlled Vital Signs Assessment: post-procedure vital signs reviewed and stable Respiratory status: spontaneous breathing and respiratory function stable Cardiovascular status: blood pressure returned to baseline and stable Postop Assessment: no headache and no apparent nausea or vomiting Anesthetic complications: no Comments: Late entry   No notable events documented.   Last Vitals:  Vitals:   05/13/24 0917 05/13/24 1023  BP: (!) 142/80 112/73  Pulse: 66 71  Resp: 15 12  Temp: 36.5 C 36.5 C  SpO2: 99% 99%    Last Pain:  Vitals:   05/13/24 1023  TempSrc: Oral  PainSc: 0-No pain                 Yvonna PARAS [name]      "

## 2024-05-16 ENCOUNTER — Encounter (HOSPITAL_COMMUNITY): Payer: Self-pay | Admitting: Ophthalmology

## 2024-05-27 ENCOUNTER — Ambulatory Visit: Payer: Self-pay

## 2024-05-27 NOTE — Telephone Encounter (Signed)
 FYI Only or Action Required?: FYI only for provider: appointment scheduled on 05/30/24.  Patient was last seen in primary care on 02/09/2024 by Cook, Jayce G, DO.  Called Nurse Triage reporting Headache.  Symptoms began yesterday.  Interventions attempted: OTC medications: tylenol .  Symptoms are: unchanged.  Triage Disposition: See PCP When Office is Open (Within 3 Days)  Patient/caregiver understands and will follow disposition?: Yes   Copied from CRM #[id number]. Topic: Clinical - Red Word Triage >> May 27, 2024 11:42 AM Suzen RAMAN wrote: Red Word that prompted transfer to Nurse Triage:feeling weak,denies temp and headache(rated at 4) and cold chills. Requesting an appt >> May 27, 2024 11:56 AM Delon T wrote: Patient returning call    Reason for Disposition  [1] MILD-MODERATE headache AND [2] present > 3 days (72 hours) AND [3] no improvement after using Care Advice  Answer Assessment - Initial Assessment Questions Pt called in request appt with PCP for h/a, chills w/o fever and fatigue. Pt denies fever, SOB, dizziness/ lightheadedness. Pt reports h/a started yesterday 01/01 and managed with tyelnol. Pt reports chills but no fever or other symptoms. Pt reports recent cataract surgery 12/19 but he f/u with provider 12/29 and cleared at this time. Discussed home care of tylenol  and cold packs, decreasing sensory/flashing lights. Pt voiced understanding. Appointment scheduled for evaluation. Patient agrees with plan of care, and will call back if anything changes, or if symptoms worsen.      1. LOCATION: Where does it hurt?      H/a  2. ONSET: When did the headache start? (e.g., minutes, hours, days)      Yesterday   3. PATTERN: Does the pain come and go, or has it been constant since it started?     Constant   4. SEVERITY: How bad is the pain? and What does it keep you from doing?  (e.g., Scale 1-10; mild, moderate, or severe)     Mild; 4/10  5. RECURRENT SYMPTOM:  Have you ever had headaches before? If Yes, ask: When was the last time? and What happened that time?      Yes; pt taking tylenol    6. CAUSE: What do you think is causing the headache?     Unsure; pt does report recent cataract surgery 12/19 but has f/u and denies any complications with vision   9. OTHER SYMPTOMS: Do you have any other symptoms? (e.g., fever, stiff neck, eye pain, sore throat, cold symptoms)     Chills; denies fever. Reports fatigue  Protocols used: Headache-A-AH

## 2024-05-30 ENCOUNTER — Encounter: Payer: Self-pay | Admitting: Family Medicine

## 2024-05-30 ENCOUNTER — Ambulatory Visit: Admitting: Family Medicine

## 2024-05-30 VITALS — BP 135/81 | HR 70 | Temp 98.8°F | Ht 71.0 in | Wt 232.0 lb

## 2024-05-30 DIAGNOSIS — B349 Viral infection, unspecified: Secondary | ICD-10-CM | POA: Diagnosis not present

## 2024-05-30 MED ORDER — ONDANSETRON 4 MG PO TBDP: 4.0000 mg | ORAL_TABLET | Freq: Three times a day (TID) | ORAL | 0 refills | Status: AC | PRN

## 2024-05-30 NOTE — Patient Instructions (Signed)
Rest. Fluids.  Medication as prescribed.

## 2024-05-30 NOTE — Assessment & Plan Note (Signed)
 Overall well-appearing.  Zofran  as needed.  Supportive care.

## 2024-05-30 NOTE — Progress Notes (Signed)
 "  Subjective:  Patient ID: Oscar Campbell, male    DOB: 04-Dec-1955  Age: 69 y.o. MRN: [id number]  CC:   Chief Complaint  Patient presents with   weakness, dizziness, nausea-    Flu like symptoms since Thursday     HPI:  69 year old male presents for evaluation of the above.  Patient reports that he has been sick since Thursday.  He reports generalized weakness, nausea, chills but no fever.  He did have a brief episode of shortness of breath but this resolved very quickly.  No cough or runny nose or other respiratory symptoms.  He states that he is currently feeling better.  However, he wanted to be evaluated given the current influenza in our communities.  Patient Active Problem List   Diagnosis Date Noted   Viral illness 06/04/2021   Type 2 diabetes mellitus without complications (HCC) 12/16/2020   Mixed hyperlipidemia 12/16/2020   Kidney stones 05/15/2015   Erectile dysfunction 01/20/2013   Essential hypertension 01/20/2011    Social Hx   Social History   Socioeconomic History   Marital status: Divorced    Spouse name: Not on file   Number of children: Not on file   Years of education: Not on file   Highest education level: 12th grade  Occupational History   Not on file  Tobacco Use   Smoking status: Never   Smokeless tobacco: Never  Vaping Use   Vaping status: Never Used  Substance and Sexual Activity   Alcohol use: No    Alcohol/week: 0.0 standard drinks of alcohol   Drug use: No   Sexual activity: Yes  Other Topics Concern   Not on file  Social History Narrative   Not on file   Social Drivers of Health   Tobacco Use: Low Risk (05/30/2024)   Patient History    Smoking Tobacco Use: Never    Smokeless Tobacco Use: Never    Passive Exposure: Not on file  Financial Resource Strain: High Risk (05/28/2024)   Overall Financial Resource Strain (CARDIA)    Difficulty of Paying Living Expenses: Hard  Food Insecurity: Food Insecurity Present (05/28/2024)   Epic     Worried About Programme Researcher, Broadcasting/film/video in the Last Year: Often true    Ran Out of Food in the Last Year: Often true  Transportation Needs: No Transportation Needs (05/28/2024)   Epic    Lack of Transportation (Medical): No    Lack of Transportation (Non-Medical): No  Physical Activity: Insufficiently Active (05/28/2024)   Exercise Vital Sign    Days of Exercise per Week: 2 days    Minutes of Exercise per Session: 30 min  Stress: Stress Concern Present (05/28/2024)   [employer] of Occupational Health - Occupational Stress Questionnaire    Feeling of Stress: To some extent  Social Connections: Moderately Integrated (05/28/2024)   Social Connection and Isolation Panel    Frequency of Communication with Friends and Family: Three times a week    Frequency of Social Gatherings with Friends and Family: More than three times a week    Attends Religious Services: More than 4 times per year    Active Member of Clubs or Organizations: Yes    Attends Banker Meetings: More than 4 times per year    Marital Status: Divorced  Depression (PHQ2-9): Low Risk (10/12/2023)   Depression (PHQ2-9)    PHQ-2 Score: 4  Recent Concern: Depression (PHQ2-9) - Medium Risk (08/04/2023)   Depression (PHQ2-9)  PHQ-2 Score: 6  Alcohol Screen: Low Risk (07/31/2023)   Alcohol Screen    Last Alcohol Screening Score (AUDIT): 0  Housing: Low Risk (05/28/2024)   Epic    Unable to Pay for Housing in the Last Year: No    Number of Times Moved in the Last Year: 0    Homeless in the Last Year: No  Utilities: Not on file  Health Literacy: Not on file    Review of Systems Per HPI  Objective:  BP 135/81   Pulse 70   Temp 98.8 F (37.1 C)   Ht 5' 11 (1.803 m)   Wt 232 lb (105.2 kg)   SpO2 96%   BMI 32.36 kg/m      05/30/2024    4:28 PM 05/13/2024   10:23 AM 05/13/2024    9:17 AM  BP/Weight  Systolic BP 135 112 142  Diastolic BP 81 73 80  Wt. (Lbs) 232  215  BMI 32.36 kg/m2  29.99 kg/m2    Physical  Exam Vitals and nursing note reviewed.  Constitutional:      General: He is not in acute distress.    Appearance: Normal appearance.  HENT:     Head: Normocephalic and atraumatic.  Eyes:     General:        Right eye: No discharge.        Left eye: No discharge.     Conjunctiva/sclera: Conjunctivae normal.  Cardiovascular:     Rate and Rhythm: Normal rate and regular rhythm.  Pulmonary:     Effort: Pulmonary effort is normal.     Breath sounds: Normal breath sounds. No wheezing, rhonchi or rales.  Neurological:     Mental Status: He is alert.  Psychiatric:        Mood and Affect: Mood normal.        Behavior: Behavior normal.     Lab Results  Component Value Date   WBC 9.7 08/04/2023   HGB 16.1 08/04/2023   HCT 48.1 08/04/2023   PLT 206 08/04/2023   GLUCOSE 118 (H) 02/01/2024   CHOL 110 02/01/2024   TRIG 94 02/01/2024   HDL 43 02/01/2024   LDLCALC 49 02/01/2024   ALT 26 02/01/2024   AST 25 02/01/2024   NA 138 02/01/2024   K 3.9 02/01/2024   CL 102 02/01/2024   CREATININE 0.93 02/01/2024   BUN 10 02/01/2024   CO2 20 02/01/2024   PSA 1.03 12/15/2013   HGBA1C 5.9 (H) 02/01/2024     Assessment & Plan:  Viral illness Assessment & Plan: Overall well-appearing.  Zofran  as needed.  Supportive care.  Orders: -     Ondansetron ; Take 1 tablet (4 mg total) by mouth every 8 (eight) hours as needed for nausea or vomiting.  Dispense: 20 tablet; Refill: 0    Follow-up: Already has follow-up  Jacqulyn Ahle DO [facility] "

## 2024-06-08 ENCOUNTER — Other Ambulatory Visit (HOSPITAL_COMMUNITY)

## 2024-06-09 ENCOUNTER — Telehealth: Payer: Self-pay | Admitting: Urology

## 2024-06-09 DIAGNOSIS — R399 Unspecified symptoms and signs involving the genitourinary system: Secondary | ICD-10-CM

## 2024-06-09 NOTE — Telephone Encounter (Signed)
 Dysuria  Patient called with c/o dysuria x 1 week days.  Pain: tingling  Severity:7/10  Associated Signs and Symptoms:  Fever: noTemp. Chills: no Hematuria: no Urgency: yes Frequency: no Hesitancy:yes Incontinence: no Nausea: no Vomiting: no  Message sent to clinic staff to return call to patient to advise of plan.

## 2024-06-10 ENCOUNTER — Other Ambulatory Visit

## 2024-06-10 ENCOUNTER — Ambulatory Visit (HOSPITAL_COMMUNITY): Admit: 2024-06-10 | Admitting: Ophthalmology

## 2024-06-10 DIAGNOSIS — R399 Unspecified symptoms and signs involving the genitourinary system: Secondary | ICD-10-CM

## 2024-06-10 LAB — URINALYSIS, ROUTINE W REFLEX MICROSCOPIC
Bilirubin, UA: NEGATIVE
Ketones, UA: NEGATIVE
Nitrite, UA: POSITIVE — AB
Specific Gravity, UA: 1.025 (ref 1.005–1.030)
Urobilinogen, Ur: 0.2 mg/dL (ref 0.2–1.0)
pH, UA: 5.5 (ref 5.0–7.5)

## 2024-06-10 LAB — MICROSCOPIC EXAMINATION: WBC, UA: >30 /HPF — AB (ref 0–5)

## 2024-06-10 SURGERY — PHACOEMULSIFICATION, CATARACT, WITH IOL INSERTION
Anesthesia: Monitor Anesthesia Care | Laterality: Left

## 2024-06-10 MED ORDER — DOXYCYCLINE HYCLATE 100 MG PO CAPS: 100.0000 mg | ORAL_CAPSULE | Freq: Two times a day (BID) | ORAL | 0 refills | Status: AC

## 2024-06-10 NOTE — Addendum Note (Signed)
 Addended by: SAMMIE EXIE HERO on: 06/10/2024 01:28 PM   Modules accepted: Orders

## 2024-06-10 NOTE — Telephone Encounter (Signed)
" ° °  Urologic History:  Any Recent Urologic Surgeries or Procedures:no Recurrent UTI's:no Cystitis: yes  Prostatitis:no Kidney or Bladder Stones: yes: 2017 Plan: Walk-in Clinic: no Appointment w/Physician: [no Lab visit scheduled for urine drop off: Yes Advice given: UA drop off  Do you take on daily medications for UTI suppression No   "

## 2024-06-10 NOTE — Telephone Encounter (Signed)
 Sent mychart message to patient to let him know per verbal from MD McKenzie,  UA came back positive for infection, please send in doxy.

## 2024-06-10 NOTE — Telephone Encounter (Signed)
 Patient left a voice message 06-10-2024.  Needing a call back regarding below symptoms.  Please advise.  Call:  [phone]

## 2024-06-13 LAB — URINE CULTURE

## 2024-06-14 ENCOUNTER — Ambulatory Visit: Payer: Self-pay

## 2024-08-08 ENCOUNTER — Ambulatory Visit: Admitting: Family Medicine

## 2024-11-04 ENCOUNTER — Other Ambulatory Visit

## 2024-11-11 ENCOUNTER — Ambulatory Visit: Admitting: Urology

## 2025-01-13 ENCOUNTER — Ambulatory Visit
